# Patient Record
Sex: Male | Born: 1955 | Race: White | Hispanic: No | Marital: Single | Smoking: Never smoker
Health system: Southern US, Community
[De-identification: ages and names within clinical notes are randomized; demographics above are authoritative.]

## PROBLEM LIST (undated history)

## (undated) DIAGNOSIS — J342 Deviated nasal septum: Secondary | ICD-10-CM

## (undated) DIAGNOSIS — N400 Enlarged prostate without lower urinary tract symptoms: Secondary | ICD-10-CM

## (undated) DIAGNOSIS — E039 Hypothyroidism, unspecified: Secondary | ICD-10-CM

## (undated) DIAGNOSIS — I10 Essential (primary) hypertension: Secondary | ICD-10-CM

## (undated) DIAGNOSIS — K219 Gastro-esophageal reflux disease without esophagitis: Secondary | ICD-10-CM

## (undated) DIAGNOSIS — M1990 Unspecified osteoarthritis, unspecified site: Secondary | ICD-10-CM

## (undated) DIAGNOSIS — I82409 Acute embolism and thrombosis of unspecified deep veins of unspecified lower extremity: Secondary | ICD-10-CM

## (undated) DIAGNOSIS — E785 Hyperlipidemia, unspecified: Secondary | ICD-10-CM

## (undated) DIAGNOSIS — J45909 Unspecified asthma, uncomplicated: Secondary | ICD-10-CM

## (undated) DIAGNOSIS — G4730 Sleep apnea, unspecified: Secondary | ICD-10-CM

## (undated) HISTORY — DX: Hyperlipidemia, unspecified: E78.5

## (undated) HISTORY — PX: CARDIOVERSION: SHX1299

## (undated) HISTORY — DX: Deviated nasal septum: J34.2

## (undated) HISTORY — DX: Acute embolism and thrombosis of unspecified deep veins of unspecified lower extremity: I82.409

## (undated) HISTORY — DX: Benign prostatic hyperplasia without lower urinary tract symptoms: N40.0

---

## 1999-11-01 ENCOUNTER — Ambulatory Visit (HOSPITAL_COMMUNITY): Admission: EM | Admit: 1999-11-01 | Discharge: 1999-11-01 | Payer: Self-pay

## 2002-08-03 ENCOUNTER — Encounter: Payer: Self-pay | Admitting: Otolaryngology

## 2002-08-03 ENCOUNTER — Encounter: Admission: RE | Admit: 2002-08-03 | Discharge: 2002-08-03 | Payer: Self-pay | Admitting: Otolaryngology

## 2002-09-13 ENCOUNTER — Encounter: Admission: RE | Admit: 2002-09-13 | Discharge: 2002-09-13 | Payer: Self-pay | Admitting: Otolaryngology

## 2002-09-13 ENCOUNTER — Encounter: Payer: Self-pay | Admitting: Otolaryngology

## 2002-10-31 ENCOUNTER — Ambulatory Visit (HOSPITAL_BASED_OUTPATIENT_CLINIC_OR_DEPARTMENT_OTHER): Admission: RE | Admit: 2002-10-31 | Discharge: 2002-10-31 | Payer: Self-pay | Admitting: Otolaryngology

## 2005-06-23 HISTORY — PX: HERNIA REPAIR: SHX51

## 2006-04-14 ENCOUNTER — Ambulatory Visit (HOSPITAL_COMMUNITY): Admission: RE | Admit: 2006-04-14 | Discharge: 2006-04-14 | Payer: Self-pay | Admitting: General Surgery

## 2009-06-25 ENCOUNTER — Ambulatory Visit: Payer: Self-pay | Admitting: Surgery

## 2009-06-25 ENCOUNTER — Encounter (INDEPENDENT_AMBULATORY_CARE_PROVIDER_SITE_OTHER): Payer: Self-pay | Admitting: Family Medicine

## 2009-06-25 ENCOUNTER — Ambulatory Visit: Admission: RE | Admit: 2009-06-25 | Discharge: 2009-06-25 | Payer: Self-pay | Admitting: Family Medicine

## 2010-02-11 ENCOUNTER — Encounter: Admission: RE | Admit: 2010-02-11 | Discharge: 2010-02-11 | Payer: Self-pay | Admitting: Internal Medicine

## 2010-11-08 NOTE — Procedures (Signed)
[facility]  Patient:    Michael Parsons, Michael Parsons                       MRN: [id number] Proc. Date: 11/01/99 Attending:  Verlin Grills, M.D. CC:         Petra Kuba, M.D.                           Procedure Report  PROCEDURE:  Esophagogastroduodenoscopy.  INDICATIONS:  Mr. Michael Parsons is a 55 year old male who has roast beef obstructing his esophagus.  Mr. Michael Parsons is followed by Dr. Vida Rigger and takes Prevacid for chronic gastroesophageal reflux.  He last required esophageal dilation approximately two years ago.  I discussed with Mr. Michael Parsons the complications associated with esophagogastroduodenoscopy including intestinal bleeding and intestinal perforation.  Mr. Michael Parsons has signed the operative permit.  ENDOSCOPIST:  Verlin Grills, M.D.  PREMEDICATION:  Demerol 100 mg, Versed 10 mg.  ENDOSCOPE:  Olympus gastroscope.  DESCRIPTION OF PROCEDURE:  After obtaining informed consent, the patient was placed in the left lateral decubitus position.  I administered intravenous Demerol and intravenous Versed to achieve conscious sedation for the procedure.  The patients blood pressure, oxygen saturation, and cardiac rhythm were monitored throughout the procedure and documented in the medical record.  The Olympus gastroscope was passed through the posterior hypopharynx into the proximal esophagus without difficulty.  The hypopharynx, pharynx, and vocal chords appeared normal.  ESOPHAGOSCOPY:  A proximal and mid esophagus appears normal.  There is food obstructing the distal esophagus.  With gentle distal esophageal irrigation and gentle pressure applied to the obstructing food bolus, I was able to push the obstructing food bolus into the stomach.  Examination of the distal esophagus once the obstruction was removed reveals mild mucosal scarring and mucosal erosions but no tight esophageal stricture.  GASTROSCOPY:  Retroflex view of the  gastric cardia was normal.  The gastric body and fundus is filled with solid food.  The gastric antrum appears normal. The pylorus appears normal.  DUODENOSCOPY:  The duodenal bulb and descending duodenum appears normal.  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  Food obstructing the distal esophagus relieved.  PLAN:  I will ask Michael Parsons to follow up with Dr. Vida Rigger.  He may require an esophageal dilation. DD:  11/02/99 TD:  11/05/99 Job: [id number]

## 2010-11-08 NOTE — Op Note (Signed)
Michael Parsons, Michael Parsons                          ACCOUNT NO.:  [id number]   MEDICAL RECORD NO.:  [id number]                   PATIENT TYPE:  AMB   LOCATION:  DSC                                  FACILITY:  [facility]   PHYSICIAN:  Karol T. Lazarus Salines, M.D.              DATE OF BIRTH:  02-23-1956   DATE OF PROCEDURE:  10/31/2002  DATE OF DISCHARGE:                                 OPERATIVE REPORT   PREOPERATIVE DIAGNOSES:  1. Dysphagia.  2. Question esophageal mass.   POSTOPERATIVE DIAGNOSES:  1. No mass identified.  2. Reflux esophagitis.   PROCEDURES:  1. Direct laryngoscopy.  2. Esophagoscopy.  3. Maloney dilation.   SURGEON:  Gloris Manchester. Lazarus Salines, M.D.   ANESTHESIA:  General orotracheal.   ESTIMATED BLOOD LOSS:  None.   COMPLICATIONS:  None.   FINDINGS:  Normal oral cavity and oropharynx to visualization and palpation.  Normal hypopharynx and larynx.  Cervical esophagoscopy and then a full-  length esophagoscopy, no mass identified in the thoracic inlet region.  Slight distal esophagitis consistent with reflux.  No stricture identified,  but I could not pass an endoscope all the way into the stomach.  Easily  dilated to the 46 French size without any apparent obstruction or  restriction.   DESCRIPTION OF PROCEDURE:  With the patient in a comfortable supine  position, general orotracheal anesthesia was induced without difficulty.  At  an appropriate level, the table was turned 90 degrees and the patient placed  in a slightly reverse Trendelenburg.  A clean preparation and draping was  accomplished.  A rubber tooth guard was placed on the upper teeth.  The  Holinger laryngoscope was introduced and inspection of the oropharynx and  hypopharynx and larynx was performed with the findings as described above.  The instrument was removed.  The cervical esophagoscope was lubricated and  inserted into the pharynx and easily passed through the cricopharyngeus and  to the full length of  the instrument with no significant findings.  There  were some frothy reflux secretions.  Even with careful palpation and  corroboration with the barium swallow and the MRI scan, which were in the  room, no mass could be identified.  No evidence of a polyp or a  diverticulum.  No surface irregularity or visible lesion.  The cervical  esophagoscope was removed.   The full-length esophagoscope was lubricated.  The patient was placed in a  more extended position, and this was inserted and passed also to its full  length with the findings as described above.  Once again careful inspection  in the thoracic inlet region where the mass was visible on the barium  swallow and the MRI scan was completely negative.  The esophagoscope was  removed.   Beginning with a #40 Maloney dilator lubricated with K-Y jelly, this was  passed into the pharynx with digital guidance and passed to the full length  of the dilator with absolutely no restriction and no difficulty.  Upon  removing the dilator, there was no blood visible.  Next a 75 and finally a  46 [place] Maloney dilator were used in the same fashion.  Again there was no  restriction.  They were passed easily, and there was no evidence of blood.  At this point the procedure was completed.  The rubber tooth guard was  removed and the dental status was intact.  The patient was returned to  anesthesia, awakened, extubated, and transferred to recovery in stable  condition.   COMMENT:  A 55 year old white male with a long history of incompletely-  controlled reflux, a known distal esophageal stricture, one episode of food  impaction several years back, and now a history of progressive globus  sensation and dysphagia was the indication for his evaluation.  Evaluation  with a barium swallow suggested a smooth mass in the right lateral esophagus  at just below the thoracic inlet.  An MRI scan showed a similar soft tissue  lesion in the same vicinity  approximately 1 cm in greatest extent.  On  today's endoscopy, no lesion was identified.  The lesion was suspected to be  submucosal, possibly a leiomyoma, which corroborates today's negative  findings.  He does have some distal esophagitis but no evidence of stricture  at the present time.  Given low anticipated risk of postanesthetic or  postsurgical complications, feel an outpatient venue is appropriate.  Will  emphasize analgesia, reflux control, and advancement of diet.  I would  recommend either a repeat barium swallow or a repeat MRI scan in six months  to make sure that the lesion is not  enlarging or progressing.                                               Gloris Manchester. Lazarus Salines, M.D.    KTW/MEDQ  D:  10/31/2002  T:  11/01/2002  Job:  [id number]   cc:   Theressa Millard, M.D.  [address] [place]  [place] [address]  Fax: [phone]   Petra Kuba, M.D.  [address], [address]  [place]  [place] [address]  Fax: [phone]

## 2010-11-08 NOTE — Op Note (Signed)
NAME:  Michael Parsons, Michael Parsons              ACCOUNT NO.:  [id number]   MEDICAL RECORD NO.:  [id number]          PATIENT TYPE:  AMB   LOCATION:  DAY                          FACILITY:  [facility]   PHYSICIAN:  Adolph Pollack, M.D.DATE OF BIRTH:  08/26/1955   DATE OF PROCEDURE:  04/14/2006  DATE OF DISCHARGE:                                 OPERATIVE REPORT   PREOPERATIVE DIAGNOSIS:  Umbilical hernia.   POSTOPERATIVE DIAGNOSIS:  Umbilical hernia.   PROCEDURE:  Umbilical hernia repair with mesh.   SURGEON:  Adolph Pollack, M.D.   ANESTHESIA:  General plus local (0.25% Marcaine with epinephrine).   INDICATIONS:  This is a 55 year old male who has noticed a progressively  enlarging bulge around the umbilicus.  It is consistent with a moderate- to  large-sized umbilical hernia with some stretching of the skin.  He now  presents for repair.  We have discussed the procedure, risks and aftercare  preoperatively.   TECHNIQUE:  He was seen in the holding area and then brought to the  operating room, placed supine on the operating table and a general  anesthetic was administered.  The hair in the periumbilical area was clipped  and the area was sterilely prepped and draped.  Marcaine solution was  infiltrated superficially and deep in a periumbilical fashion.  A  subumbilical curvilinear incision was made through the skin and subcutaneous  tissue until fascia was identified.  Electrocautery was used to separate the  subcutaneous tissue from the fascia in a periumbilical fashion.  The  umbilicus was freed from its fascial attachments, exposing the underlying  hernia, and the sac was opened, partially excised and omental contents  reduced back into the abdominal cavity.  I then made sure I had adequate  fascial exposure about 3 cm circumferentially around the primary defect.  The defect was then closed primarily with interrupted 0 Surgilon sutures,  which were left long.  A piece of polypropylene  mesh was brought into the  field; the sutures were then threaded over the mesh and tied down, directly  anchoring the center portion of the mesh over the primary repair.  The  periphery of the mesh was then anchored to the fascia with a running 0  Prolene suture.  This provided for more than adequate coverage of the defect  as well as adequate overlap.   The excess mesh was trimmed.  Local anesthetic was infiltrated in the  fascia.  Hemostasis was adequate.  I then reimplanted the umbilicus on the  mesh with a 3-0 Vicryl suture.  The subcutaneous tissue was closed over the  mesh with a running 3-0 Vicryl suture.  The skin was closed with a 4-0  Monocryl subcuticular stitch followed by Steri-Strips and a sterile  dressing.   He tolerated the procedure well without any apparent complications and was  taken to the recovery room in satisfactory condition.      Adolph Pollack, M.D.  Electronically Signed     TJR/MEDQ  D:  04/14/2006  T:  04/15/2006  Job:  [id number]   cc:   Theressa Millard, M.D.  Fax: [phone]

## 2012-10-18 ENCOUNTER — Other Ambulatory Visit: Payer: Self-pay | Admitting: Internal Medicine

## 2012-10-18 DIAGNOSIS — M79641 Pain in right hand: Secondary | ICD-10-CM

## 2012-10-18 DIAGNOSIS — M5412 Radiculopathy, cervical region: Secondary | ICD-10-CM

## 2012-10-18 DIAGNOSIS — M542 Cervicalgia: Secondary | ICD-10-CM

## 2012-10-21 ENCOUNTER — Ambulatory Visit
Admission: RE | Admit: 2012-10-21 | Discharge: 2012-10-21 | Disposition: A | Discharge status: Home / Self Care | Payer: BC Managed Care – PPO | Source: Ambulatory Visit | Attending: Internal Medicine | Admitting: Internal Medicine

## 2012-10-21 ENCOUNTER — Other Ambulatory Visit: Payer: Self-pay | Admitting: Internal Medicine

## 2012-10-21 DIAGNOSIS — M5412 Radiculopathy, cervical region: Secondary | ICD-10-CM

## 2012-10-21 DIAGNOSIS — Z139 Encounter for screening, unspecified: Secondary | ICD-10-CM

## 2012-10-21 DIAGNOSIS — M79641 Pain in right hand: Secondary | ICD-10-CM

## 2012-10-21 DIAGNOSIS — M542 Cervicalgia: Secondary | ICD-10-CM

## 2012-10-23 ENCOUNTER — Other Ambulatory Visit: Payer: Self-pay

## 2012-11-02 ENCOUNTER — Encounter (HOSPITAL_COMMUNITY): Payer: Self-pay | Admitting: Pharmacy Technician

## 2012-11-05 ENCOUNTER — Encounter (HOSPITAL_COMMUNITY): Payer: Self-pay

## 2012-11-05 ENCOUNTER — Encounter (HOSPITAL_COMMUNITY)
Admission: RE | Admit: 2012-11-05 | Discharge: 2012-11-05 | Disposition: A | Discharge status: Home / Self Care | Payer: BC Managed Care – PPO | Source: Ambulatory Visit | Attending: Neurological Surgery | Admitting: Neurological Surgery

## 2012-11-05 ENCOUNTER — Ambulatory Visit (HOSPITAL_COMMUNITY)
Admission: RE | Admit: 2012-11-05 | Discharge: 2012-11-05 | Disposition: A | Discharge status: Home / Self Care | Payer: BC Managed Care – PPO | Source: Ambulatory Visit | Attending: Anesthesiology | Admitting: Anesthesiology

## 2012-11-05 DIAGNOSIS — M5090 Cervical disc disorder, unspecified, unspecified cervical region: Secondary | ICD-10-CM | POA: Insufficient documentation

## 2012-11-05 DIAGNOSIS — I359 Nonrheumatic aortic valve disorder, unspecified: Secondary | ICD-10-CM | POA: Insufficient documentation

## 2012-11-05 DIAGNOSIS — Z01812 Encounter for preprocedural laboratory examination: Secondary | ICD-10-CM | POA: Insufficient documentation

## 2012-11-05 DIAGNOSIS — I4510 Unspecified right bundle-branch block: Secondary | ICD-10-CM | POA: Insufficient documentation

## 2012-11-05 DIAGNOSIS — Z01818 Encounter for other preprocedural examination: Secondary | ICD-10-CM | POA: Insufficient documentation

## 2012-11-05 DIAGNOSIS — Z01810 Encounter for preprocedural cardiovascular examination: Secondary | ICD-10-CM | POA: Insufficient documentation

## 2012-11-05 HISTORY — DX: Essential (primary) hypertension: I10

## 2012-11-05 HISTORY — DX: Unspecified osteoarthritis, unspecified site: M19.90

## 2012-11-05 HISTORY — DX: Hypothyroidism, unspecified: E03.9

## 2012-11-05 HISTORY — DX: Unspecified asthma, uncomplicated: J45.909

## 2012-11-05 HISTORY — DX: Gastro-esophageal reflux disease without esophagitis: K21.9

## 2012-11-05 HISTORY — DX: Sleep apnea, unspecified: G47.30

## 2012-11-05 LAB — BASIC METABOLIC PANEL
BUN: 20 mg/dL (ref 6–23)
Calcium: 9.3 mg/dL (ref 8.4–10.5)
Chloride: 102 mEq/L (ref 96–112)
Creatinine, Ser: 0.89 mg/dL (ref 0.50–1.35)
GFR calc Af Amer: >90 mL/min (ref >90)

## 2012-11-05 LAB — SURGICAL PCR SCREEN: MRSA, PCR: NEGATIVE

## 2012-11-05 LAB — CBC
HCT: 46.1 % (ref 39.0–52.0)
MCHC: 35.4 g/dL (ref 30.0–36.0)
MCV: 88.1 fL (ref 78.0–100.0)
Platelets: 218 10*3/uL (ref 150–400)
RDW: 13.7 % (ref 11.5–15.5)

## 2012-11-05 NOTE — Pre-Procedure Instructions (Signed)
Michael Parsons  11/05/2012   Your procedure is scheduled on:  Thursday, May 22nd  Report to [facility] at 1030 AM.  Call this number if you have problems the morning of surgery: [phone]   Remember:   Do not eat food or drink liquids after midnight.    Take these medicines the morning of surgery with A SIP OF WATER: Zyrtec, Advair, Nasalide, Synthroid, Prilosec, Percocet if needed   Do not wear jewelry.  Do not wear lotions, powders, or perfumes, deodorant.  Do not shave 48 hours prior to surgery. Men may shave face and neck.  Do not bring valuables to the hospital.  Contacts, dentures or bridgework may not be worn into surgery.  Leave suitcase in the car. After surgery it may be brought to your room.  For patients admitted to the hospital, checkout time is 11:00 AM the day of discharge.   Patients discharged the day of surgery will not be allowed to drive home.   Special Instructions: Shower using CHG 2 nights before surgery and the night before surgery.  If you shower the day of surgery use CHG.  Use special wash - you have one bottle of CHG for all showers.  You should use approximately 1/3 of the bottle for each shower.   Please read over the following fact sheets that you were given: Pain Booklet, Coughing and Deep Breathing, MRSA Information and Surgical Site Infection Prevention

## 2012-11-05 NOTE — Progress Notes (Signed)
Primary Physician - Dr. Sharlyn Bologna No cardiologist. No cardiac testing since 90s

## 2012-11-08 NOTE — Progress Notes (Signed)
Anesthesia chart review: Patient is a 57 year old male posted for 2 level ACDF by Dr. Yetta Barre on 11/11/2012.  History includes nonsmoker, obesity, hypertension, obstructive sleep apnea, hypothyroidism, GERD, asthma, arthritis, hernia repair. PCP is Dr. Sharlyn Bologna.  EKG on 11/05/12 showed NSR, possible LAE, incomplete right BBB.  He denied prior cardiac testing since at least the 1990's.  No CV symptoms were documented at his PAT visit.  Preoperative lab and chest x-ray noted.  Patient will be evaluated by his assigned anesthesiologist on the day of surgery. If no acute change in his status then anticipate he can proceed as planned.  [facility]/Anesthesiology Phone [phone] 11/08/2012 5:50 PM

## 2012-11-09 NOTE — Progress Notes (Signed)
REQUESTED ORDERS FROM DR. Yetta Barre OFFICE, VANESSA STATED SHE WOULD TAKE CARE OF IT.

## 2012-11-10 ENCOUNTER — Other Ambulatory Visit: Payer: Self-pay | Admitting: Neurological Surgery

## 2012-11-10 MED ORDER — DEXAMETHASONE SODIUM PHOSPHATE 10 MG/ML IJ SOLN
10.0000 mg | INTRAMUSCULAR | Status: AC
  Administered 2012-11-11: 10 mg via INTRAVENOUS
  Filled 2012-11-10: qty 1

## 2012-11-10 MED ORDER — CEFAZOLIN SODIUM-DEXTROSE 2-3 GM-% IV SOLR
2.0000 g | INTRAVENOUS | Status: AC
  Administered 2012-11-11: 2 g via INTRAVENOUS
  Filled 2012-11-10: qty 50

## 2012-11-11 ENCOUNTER — Encounter (HOSPITAL_COMMUNITY): Payer: Self-pay | Admitting: *Deleted

## 2012-11-11 ENCOUNTER — Ambulatory Visit (HOSPITAL_COMMUNITY): Payer: BC Managed Care – PPO | Admitting: Anesthesiology

## 2012-11-11 ENCOUNTER — Ambulatory Visit (HOSPITAL_COMMUNITY)
Admission: RE | Admit: 2012-11-11 | Discharge: 2012-11-12 | Disposition: A | Discharge status: Home / Self Care | Payer: BC Managed Care – PPO | Source: Ambulatory Visit | Attending: Neurological Surgery | Admitting: Neurological Surgery

## 2012-11-11 ENCOUNTER — Ambulatory Visit (HOSPITAL_COMMUNITY): Payer: BC Managed Care – PPO

## 2012-11-11 ENCOUNTER — Encounter (HOSPITAL_COMMUNITY): Payer: Self-pay | Admitting: Vascular Surgery

## 2012-11-11 ENCOUNTER — Encounter (HOSPITAL_COMMUNITY)
Admission: RE | Disposition: A | Discharge status: Home / Self Care | Payer: Self-pay | Source: Ambulatory Visit | Attending: Neurological Surgery

## 2012-11-11 DIAGNOSIS — Z981 Arthrodesis status: Secondary | ICD-10-CM

## 2012-11-11 DIAGNOSIS — Z79899 Other long term (current) drug therapy: Secondary | ICD-10-CM | POA: Insufficient documentation

## 2012-11-11 DIAGNOSIS — M5020 Other cervical disc displacement, unspecified cervical region: Secondary | ICD-10-CM | POA: Insufficient documentation

## 2012-11-11 DIAGNOSIS — M47812 Spondylosis without myelopathy or radiculopathy, cervical region: Secondary | ICD-10-CM | POA: Insufficient documentation

## 2012-11-11 DIAGNOSIS — I10 Essential (primary) hypertension: Secondary | ICD-10-CM | POA: Insufficient documentation

## 2012-11-11 HISTORY — PX: ANTERIOR CERVICAL DECOMP/DISCECTOMY FUSION: SHX1161

## 2012-11-11 LAB — CBC WITH DIFFERENTIAL/PLATELET
Basophils Absolute: 0 10*3/uL (ref 0.0–0.1)
Basophils Relative: 1 % (ref 0–1)
Eosinophils Absolute: 0.1 10*3/uL (ref 0.0–0.7)
Hemoglobin: 15.8 g/dL (ref 13.0–17.0)
MCH: 30.9 pg (ref 26.0–34.0)
MCHC: 35 g/dL (ref 30.0–36.0)
Monocytes Relative: 7 % (ref 3–12)
Neutro Abs: 5.1 10*3/uL (ref 1.7–7.7)
Neutrophils Relative %: 75 % (ref 43–77)
RDW: 13.7 % (ref 11.5–15.5)

## 2012-11-11 LAB — BASIC METABOLIC PANEL
BUN: 18 mg/dL (ref 6–23)
Calcium: 9.3 mg/dL (ref 8.4–10.5)
Creatinine, Ser: 0.85 mg/dL (ref 0.50–1.35)
GFR calc Af Amer: >90 mL/min (ref >90)
GFR calc non Af Amer: >90 mL/min (ref >90)
Glucose, Bld: 111 mg/dL — ABNORMAL HIGH (ref 70–99)

## 2012-11-11 LAB — PROTIME-INR: INR: 0.95 (ref 0.00–1.49)

## 2012-11-11 SURGERY — ANTERIOR CERVICAL DECOMPRESSION/DISCECTOMY FUSION 2 LEVELS
Anesthesia: General | Site: Spine Cervical | Wound class: Clean

## 2012-11-11 MED ORDER — SENNA 8.6 MG PO TABS
1.0000 | ORAL_TABLET | Freq: Two times a day (BID) | ORAL | Status: DC
  Administered 2012-11-11 – 2012-11-12 (×2): 8.6 mg via ORAL
  Filled 2012-11-11 (×3): qty 1

## 2012-11-11 MED ORDER — HYDROMORPHONE HCL PF 1 MG/ML IJ SOLN
INTRAMUSCULAR | Status: AC
  Filled 2012-11-11: qty 1

## 2012-11-11 MED ORDER — ONDANSETRON HCL 4 MG/2ML IJ SOLN: 4.0000 mg | INTRAMUSCULAR | Status: DC | PRN

## 2012-11-11 MED ORDER — ALBUTEROL SULFATE HFA 108 (90 BASE) MCG/ACT IN AERS
INHALATION_SPRAY | RESPIRATORY_TRACT | Status: DC | PRN
  Administered 2012-11-11: 4 via RESPIRATORY_TRACT

## 2012-11-11 MED ORDER — HEMOSTATIC AGENTS (NO CHARGE) OPTIME
TOPICAL | Status: DC | PRN
  Administered 2012-11-11: 1 via TOPICAL

## 2012-11-11 MED ORDER — HYDROCHLOROTHIAZIDE 25 MG PO TABS
25.0000 mg | ORAL_TABLET | Freq: Every day | ORAL | Status: DC
  Administered 2012-11-12: 25 mg via ORAL
  Filled 2012-11-11 (×2): qty 1

## 2012-11-11 MED ORDER — DEXAMETHASONE SODIUM PHOSPHATE 4 MG/ML IJ SOLN
4.0000 mg | Freq: Four times a day (QID) | INTRAMUSCULAR | Status: DC
  Administered 2012-11-11: 4 mg via INTRAVENOUS
  Filled 2012-11-11 (×5): qty 1

## 2012-11-11 MED ORDER — DEXAMETHASONE 4 MG PO TABS
4.0000 mg | ORAL_TABLET | Freq: Four times a day (QID) | ORAL | Status: DC
  Administered 2012-11-11 – 2012-11-12 (×2): 4 mg via ORAL
  Filled 2012-11-11 (×7): qty 1

## 2012-11-11 MED ORDER — ACETAMINOPHEN 10 MG/ML IV SOLN
1000.0000 mg | Freq: Four times a day (QID) | INTRAVENOUS | Status: DC
  Administered 2012-11-11 – 2012-11-12 (×3): 1000 mg via INTRAVENOUS
  Filled 2012-11-11 (×4): qty 100

## 2012-11-11 MED ORDER — FENTANYL CITRATE 0.05 MG/ML IJ SOLN
INTRAMUSCULAR | Status: DC | PRN
  Administered 2012-11-11: 100 ug via INTRAVENOUS
  Administered 2012-11-11 (×3): 50 ug via INTRAVENOUS

## 2012-11-11 MED ORDER — LEVOTHYROXINE SODIUM 75 MCG PO TABS
75.0000 ug | ORAL_TABLET | Freq: Every day | ORAL | Status: DC
  Administered 2012-11-12: 75 ug via ORAL
  Filled 2012-11-11 (×2): qty 1

## 2012-11-11 MED ORDER — ROCURONIUM BROMIDE 100 MG/10ML IV SOLN
INTRAVENOUS | Status: DC | PRN
  Administered 2012-11-11: 50 mg via INTRAVENOUS
  Administered 2012-11-11: 5 mg via INTRAVENOUS
  Administered 2012-11-11: 10 mg via INTRAVENOUS

## 2012-11-11 MED ORDER — ACETAMINOPHEN 325 MG PO TABS: 650.0000 mg | ORAL_TABLET | ORAL | Status: DC | PRN

## 2012-11-11 MED ORDER — BUPIVACAINE HCL (PF) 0.25 % IJ SOLN
INTRAMUSCULAR | Status: DC | PRN
  Administered 2012-11-11: 4 mL

## 2012-11-11 MED ORDER — HYDROMORPHONE HCL PF 1 MG/ML IJ SOLN
0.2500 mg | INTRAMUSCULAR | Status: DC | PRN
  Administered 2012-11-11 (×2): 0.5 mg via INTRAVENOUS

## 2012-11-11 MED ORDER — METHOCARBAMOL 100 MG/ML IJ SOLN
500.0000 mg | Freq: Four times a day (QID) | INTRAVENOUS | Status: DC | PRN
  Administered 2012-11-11: 500 mg via INTRAVENOUS
  Filled 2012-11-11: qty 5

## 2012-11-11 MED ORDER — BACITRACIN 50000 UNITS IM SOLR
INTRAMUSCULAR | Status: AC
  Filled 2012-11-11: qty 1

## 2012-11-11 MED ORDER — OXYCODONE HCL 5 MG PO TABS
ORAL_TABLET | ORAL | Status: AC
  Filled 2012-11-11: qty 1

## 2012-11-11 MED ORDER — EPHEDRINE SULFATE 50 MG/ML IJ SOLN
INTRAMUSCULAR | Status: DC | PRN
  Administered 2012-11-11 (×2): 5 mg via INTRAVENOUS

## 2012-11-11 MED ORDER — CEFAZOLIN SODIUM 1-5 GM-% IV SOLN
1.0000 g | Freq: Three times a day (TID) | INTRAVENOUS | Status: AC
  Administered 2012-11-11 – 2012-11-12 (×2): 1 g via INTRAVENOUS
  Filled 2012-11-11 (×2): qty 50

## 2012-11-11 MED ORDER — PANTOPRAZOLE SODIUM 40 MG PO TBEC
40.0000 mg | DELAYED_RELEASE_TABLET | Freq: Every day | ORAL | Status: DC
  Administered 2012-11-12: 40 mg via ORAL
  Filled 2012-11-11: qty 1

## 2012-11-11 MED ORDER — OXYCODONE HCL 5 MG/5ML PO SOLN: 5.0000 mg | Freq: Once | ORAL | Status: AC | PRN

## 2012-11-11 MED ORDER — ARTIFICIAL TEARS OP OINT
TOPICAL_OINTMENT | OPHTHALMIC | Status: DC | PRN
  Administered 2012-11-11: 1 via OPHTHALMIC

## 2012-11-11 MED ORDER — SODIUM CHLORIDE 0.9 % IR SOLN
Status: DC | PRN
  Administered 2012-11-11: 14:00:00

## 2012-11-11 MED ORDER — OXYCODONE HCL 5 MG PO TABS
10.0000 mg | ORAL_TABLET | ORAL | Status: DC | PRN
  Administered 2012-11-11 – 2012-11-12 (×3): 10 mg via ORAL
  Filled 2012-11-11 (×3): qty 2

## 2012-11-11 MED ORDER — SODIUM CHLORIDE 0.9 % IJ SOLN: 3.0000 mL | INTRAMUSCULAR | Status: DC | PRN

## 2012-11-11 MED ORDER — OXYCODONE HCL 5 MG PO TABS
5.0000 mg | ORAL_TABLET | Freq: Once | ORAL | Status: AC | PRN
  Administered 2012-11-11: 5 mg via ORAL

## 2012-11-11 MED ORDER — TURMERIC CURCUMIN 500 MG PO CAPS: 500.0000 mg | ORAL_CAPSULE | Freq: Every day | ORAL | Status: DC

## 2012-11-11 MED ORDER — THROMBIN 5000 UNITS EX SOLR
OROMUCOSAL | Status: DC | PRN
  Administered 2012-11-11: 14:00:00 via TOPICAL

## 2012-11-11 MED ORDER — THROMBIN 5000 UNITS EX SOLR
CUTANEOUS | Status: DC | PRN
  Administered 2012-11-11 (×2): 5000 [IU] via TOPICAL

## 2012-11-11 MED ORDER — ONDANSETRON HCL 4 MG/2ML IJ SOLN
INTRAMUSCULAR | Status: DC | PRN
  Administered 2012-11-11: 4 mg via INTRAVENOUS

## 2012-11-11 MED ORDER — MOMETASONE FURO-FORMOTEROL FUM 100-5 MCG/ACT IN AERO
2.0000 | INHALATION_SPRAY | Freq: Two times a day (BID) | RESPIRATORY_TRACT | Status: DC
  Administered 2012-11-11 – 2012-11-12 (×2): 2 via RESPIRATORY_TRACT
  Filled 2012-11-11: qty 8.8

## 2012-11-11 MED ORDER — GLYCOPYRROLATE 0.2 MG/ML IJ SOLN
INTRAMUSCULAR | Status: DC | PRN
  Administered 2012-11-11: 0.6 mg via INTRAVENOUS

## 2012-11-11 MED ORDER — PHENOL 1.4 % MT LIQD: 1.0000 | OROMUCOSAL | Status: DC | PRN

## 2012-11-11 MED ORDER — LIDOCAINE HCL 4 % MT SOLN
OROMUCOSAL | Status: DC | PRN
  Administered 2012-11-11: 4 mL via TOPICAL

## 2012-11-11 MED ORDER — PROPOFOL 10 MG/ML IV BOLUS
INTRAVENOUS | Status: DC | PRN
  Administered 2012-11-11: 200 mg via INTRAVENOUS

## 2012-11-11 MED ORDER — LACTATED RINGERS IV SOLN
INTRAVENOUS | Status: DC | PRN
  Administered 2012-11-11 (×2): via INTRAVENOUS

## 2012-11-11 MED ORDER — LIDOCAINE HCL (CARDIAC) 20 MG/ML IV SOLN
INTRAVENOUS | Status: DC | PRN
  Administered 2012-11-11: 80 mg via INTRAVENOUS

## 2012-11-11 MED ORDER — MIDAZOLAM HCL 5 MG/5ML IJ SOLN
INTRAMUSCULAR | Status: DC | PRN
  Administered 2012-11-11 (×2): 1 mg via INTRAVENOUS

## 2012-11-11 MED ORDER — ONDANSETRON HCL 4 MG/2ML IJ SOLN: 4.0000 mg | Freq: Four times a day (QID) | INTRAMUSCULAR | Status: DC | PRN

## 2012-11-11 MED ORDER — ACETAMINOPHEN 10 MG/ML IV SOLN
INTRAVENOUS | Status: AC
  Administered 2012-11-11: 1000 mg via INTRAVENOUS
  Filled 2012-11-11: qty 100

## 2012-11-11 MED ORDER — METHOCARBAMOL 500 MG PO TABS
500.0000 mg | ORAL_TABLET | Freq: Four times a day (QID) | ORAL | Status: DC | PRN
  Administered 2012-11-12 (×2): 500 mg via ORAL
  Filled 2012-11-11 (×3): qty 1

## 2012-11-11 MED ORDER — SODIUM CHLORIDE 0.9 % IV SOLN
INTRAVENOUS | Status: AC
  Filled 2012-11-11: qty 500

## 2012-11-11 MED ORDER — NEOSTIGMINE METHYLSULFATE 1 MG/ML IJ SOLN
INTRAMUSCULAR | Status: DC | PRN
  Administered 2012-11-11: 4 mg via INTRAVENOUS

## 2012-11-11 MED ORDER — POTASSIUM CHLORIDE IN NACL 20-0.9 MEQ/L-% IV SOLN
INTRAVENOUS | Status: DC
  Administered 2012-11-12: 03:00:00 via INTRAVENOUS
  Filled 2012-11-11 (×3): qty 1000

## 2012-11-11 MED ORDER — MENTHOL 3 MG MT LOZG: 1.0000 | LOZENGE | OROMUCOSAL | Status: DC | PRN

## 2012-11-11 MED ORDER — PHENYLEPHRINE HCL 10 MG/ML IJ SOLN
INTRAMUSCULAR | Status: DC | PRN
  Administered 2012-11-11 (×6): 80 ug via INTRAVENOUS

## 2012-11-11 MED ORDER — MORPHINE SULFATE 2 MG/ML IJ SOLN: 1.0000 mg | INTRAMUSCULAR | Status: DC | PRN

## 2012-11-11 MED ORDER — SODIUM CHLORIDE 0.9 % IJ SOLN
3.0000 mL | Freq: Two times a day (BID) | INTRAMUSCULAR | Status: DC
  Administered 2012-11-11: 3 mL via INTRAVENOUS

## 2012-11-11 MED ORDER — ACETAMINOPHEN 650 MG RE SUPP: 650.0000 mg | RECTAL | Status: DC | PRN

## 2012-11-11 MED ORDER — 0.9 % SODIUM CHLORIDE (POUR BTL) OPTIME
TOPICAL | Status: DC | PRN
  Administered 2012-11-11: 1000 mL

## 2012-11-11 MED ORDER — ZOLPIDEM TARTRATE 5 MG PO TABS: 5.0000 mg | ORAL_TABLET | Freq: Every evening | ORAL | Status: DC | PRN

## 2012-11-11 SURGICAL SUPPLY — 55 items
APL SKNCLS STERI-STRIP NONHPOA (GAUZE/BANDAGES/DRESSINGS) ×1
BAG DECANTER FOR FLEXI CONT (MISCELLANEOUS) ×2 IMPLANT
BENZOIN TINCTURE PRP APPL 2/3 (GAUZE/BANDAGES/DRESSINGS) ×2 IMPLANT
BONE MATRIX OSTEOCEL PRO SM (Bone Implant) ×1 IMPLANT
BUR MATCHSTICK NEURO 3.0 LAGG (BURR) ×2 IMPLANT
CAGE COROENT 9X13X15 (Cage) ×1 IMPLANT
CAGE LORDOTIC 8 SM PLUS (Cage) ×1 IMPLANT
CANISTER SUCTION 2500CC (MISCELLANEOUS) ×2 IMPLANT
CLOTH BEACON ORANGE TIMEOUT ST (SAFETY) ×2 IMPLANT
CONT SPEC 4OZ CLIKSEAL STRL BL (MISCELLANEOUS) ×2 IMPLANT
DRAPE C-ARM 42X72 X-RAY (DRAPES) ×4 IMPLANT
DRAPE LAPAROTOMY 100X72 PEDS (DRAPES) ×2 IMPLANT
DRAPE MICROSCOPE ZEISS OPMI (DRAPES) ×1 IMPLANT
DRAPE POUCH INSTRU U-SHP 10X18 (DRAPES) ×2 IMPLANT
DRESSING TELFA 8X3 (GAUZE/BANDAGES/DRESSINGS) ×2 IMPLANT
DRILL BIT HELIX (BIT) ×1 IMPLANT
DRSG OPSITE 4X5.5 SM (GAUZE/BANDAGES/DRESSINGS) ×2 IMPLANT
DURAPREP 6ML APPLICATOR 50/CS (WOUND CARE) ×2 IMPLANT
ELECT COATED BLADE 2.86 ST (ELECTRODE) ×2 IMPLANT
ELECT REM PT RETURN 9FT ADLT (ELECTROSURGICAL) ×2
ELECTRODE REM PT RTRN 9FT ADLT (ELECTROSURGICAL) ×1 IMPLANT
GAUZE SPONGE 4X4 16PLY XRAY LF (GAUZE/BANDAGES/DRESSINGS) IMPLANT
GLOVE BIOGEL M 8.0 STRL (GLOVE) ×2 IMPLANT
GLOVE BIOGEL PI IND STRL 8 (GLOVE) IMPLANT
GLOVE BIOGEL PI IND STRL 8.5 (GLOVE) IMPLANT
GLOVE BIOGEL PI INDICATOR 8 (GLOVE) ×2
GLOVE BIOGEL PI INDICATOR 8.5 (GLOVE) ×1
GLOVE ECLIPSE 7.5 STRL STRAW (GLOVE) ×3 IMPLANT
GLOVE ECLIPSE 8.5 STRL (GLOVE) ×1 IMPLANT
GOWN BRE IMP SLV AUR LG STRL (GOWN DISPOSABLE) IMPLANT
GOWN BRE IMP SLV AUR XL STRL (GOWN DISPOSABLE) ×2 IMPLANT
GOWN STRL REIN 2XL LVL4 (GOWN DISPOSABLE) ×2 IMPLANT
HEMOSTAT POWDER KIT SURGIFOAM (HEMOSTASIS) ×2 IMPLANT
KIT BASIN OR (CUSTOM PROCEDURE TRAY) ×2 IMPLANT
KIT ROOM TURNOVER OR (KITS) ×2 IMPLANT
NDL HYPO 25X1 1.5 SAFETY (NEEDLE) ×1 IMPLANT
NDL SPNL 20GX3.5 QUINCKE YW (NEEDLE) ×1 IMPLANT
NEEDLE HYPO 25X1 1.5 SAFETY (NEEDLE) ×2 IMPLANT
NEEDLE SPNL 20GX3.5 QUINCKE YW (NEEDLE) ×2 IMPLANT
NS IRRIG 1000ML POUR BTL (IV SOLUTION) ×2 IMPLANT
PACK LAMINECTOMY NEURO (CUSTOM PROCEDURE TRAY) ×2 IMPLANT
PAD ARMBOARD 7.5X6 YLW CONV (MISCELLANEOUS) ×6 IMPLANT
PLATE HELIX 2LVL 44MM SPINE (Plate) ×1 IMPLANT
RUBBERBAND STERILE (MISCELLANEOUS) ×4 IMPLANT
SCREW 4.0X13 (Screw) IMPLANT
SCREW 4.0X13MM (Screw) ×6 IMPLANT
SPONGE INTESTINAL PEANUT (DISPOSABLE) ×2 IMPLANT
SPONGE SURGIFOAM ABS GEL SZ50 (HEMOSTASIS) ×2 IMPLANT
STRIP CLOSURE SKIN 1/2X4 (GAUZE/BANDAGES/DRESSINGS) ×2 IMPLANT
SUT VIC AB 3-0 SH 8-18 (SUTURE) ×4 IMPLANT
SYR 20ML ECCENTRIC (SYRINGE) ×2 IMPLANT
TOWEL OR 17X24 6PK STRL BLUE (TOWEL DISPOSABLE) ×2 IMPLANT
TOWEL OR 17X26 10 PK STRL BLUE (TOWEL DISPOSABLE) ×2 IMPLANT
TRAP SPECIMEN MUCOUS 40CC (MISCELLANEOUS) IMPLANT
WATER STERILE IRR 1000ML POUR (IV SOLUTION) ×2 IMPLANT

## 2012-11-11 NOTE — Plan of Care (Signed)
Problem: Consults Goal: Diagnosis - Spinal Surgery Outcome: Completed/Met Date Met:  11/11/12 Cervical Spine Fusion

## 2012-11-11 NOTE — Anesthesia Preprocedure Evaluation (Signed)
Anesthesia Evaluation  Patient identified by MRN, date of birth, ID band Patient awake    Reviewed: Allergy & Precautions, H&P , NPO status , Patient's Chart, lab work & pertinent test results  Airway Mallampati: II  Neck ROM: full    Dental   Pulmonary asthma , sleep apnea ,          Cardiovascular hypertension,     Neuro/Psych    GI/Hepatic GERD-  ,  Endo/Other  Hypothyroidism obese  Renal/GU      Musculoskeletal   Abdominal   Peds  Hematology   Anesthesia Other Findings   Reproductive/Obstetrics                           Anesthesia Physical Anesthesia Plan  ASA: II  Anesthesia Plan: General   Post-op Pain Management:    Induction: Intravenous  Airway Management Planned: Oral ETT  Additional Equipment:   Intra-op Plan:   Post-operative Plan: Extubation in OR  Informed Consent: I have reviewed the patients History and Physical, chart, labs and discussed the procedure including the risks, benefits and alternatives for the proposed anesthesia with the patient or authorized representative who has indicated his/her understanding and acceptance.     Plan Discussed with: CRNA, Anesthesiologist and Surgeon  Anesthesia Plan Comments:         Anesthesia Quick Evaluation

## 2012-11-11 NOTE — Op Note (Signed)
11/11/2012  3:36 PM  PATIENT:  Michael Parsons  57 y.o. 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  POST-OPERATIVE DIAGNOSIS:  Same  PROCEDURE:  1. Decompressive anterior cervical discectomy C5-6 C6-7, 2. Anterior cervical arthrodesis C5-6 C6-7 utilizing peek interbody cages packed with local autograft and morcellized allograft, 3. Anterior cervical plating C5-C7 inclusive utilizing a helix plate  SURGEON:  Marikay Alar, MD  ASSISTANTS: Dr. Danielle Dess  ANESTHESIA:   General  EBL: 50 ml  Total I/O In: 1000 [I.V.:1000] Out: 50 [Blood:50]  BLOOD ADMINISTERED:none  DRAINS: None   SPECIMEN:  No Specimen  INDICATION FOR PROCEDURE: This patient presented with acute pain in the neck and right arm with inability to extend his fingers. MRI showed disc protrusions at C5-6 and C6-7 on the right with compression of the right C6 and C7 nerve roots recommended decompressive surgery given his progressive weakness in his hand. Patient understood the risks, benefits, and alternatives and potential outcomes and wished to proceed.  PROCEDURE DETAILS: Patient was brought to the operating room placed under general endotracheal anesthesia. Patient was placed in the supine position on the operating room table. The neck was prepped with Duraprep and draped in a sterile fashion.   Three cc of local anesthesia was injected and a transverse incision was made on the right side of the neck.  Dissection was carried down thru the subcutaneous tissue and the platysma was  elevated, opened, and undermined with Metzenbaum scissors.  Dissection was then carried out thru an avascular plane leaving the sternocleidomastoid carotid artery and jugular vein laterally and the trachea and esophagus medially. The ventral aspect of the vertebral column was identified and a localizing x-ray was taken. The C5-6 and C6-7 levels were identified. The  longus colli muscles were then elevated and the retractor was placed. The annulus was incised and the disc space entered. Discectomy was performed with micro-curettes and pituitary rongeurs. I then used the high-speed drill to drill the endplates down to the level of the posterior longitudinal ligament. The drill shavings were saved in a mucous trap for later arthrodesis. The operating microscope was draped and brought into the field provided additional magnification, illumination and visualization. Discectomy was continued posteriorly thru the disc space at each level. Posterior longitudinal ligament was opened with a nerve hook at each level, and then removed along with disc herniation and osteophytes, decompressing the spinal canal and thecal sac. We then continued to remove osteophytic overgrowth and disc material decompressing the neural foramina and exiting nerve roots bilaterally. Obviously particular attention was given to the right side given his right-sided symptoms. The scope was angled up and down to help decompress and undercut the vertebral bodies. Once the decompression was completed we could pass a nerve hook circumferentially to assure adequate decompression in the midline and in the neural foramina. So by both visualization and palpation we felt we had an adequate decompression of the neural elements. We then measured the height of the intravertebral disc spaces and selected a 8 millimeter Peek interbody cage for C5-6 and a 9 mm cage for C6-7 and packed the with autograft and morcellized allograft. They were then gently positioned in the intravertebral disc space and countersunk. I then used a 44 mm helix plate and placed variable angle screws into the vertebral bodies and locked them into position. The wound was irrigated with bacitracin solution, checked for hemostasis which was established and confirmed. Once  meticulous hemostasis was achieved, we then proceeded with closure. The platysma was  closed with interrupted 3-0 undyed Vicryl suture, the subcuticular layer was closed with interrupted 3-0 undyed Vicryl suture. The skin edges were approximated with steristrips. The drapes were removed. A sterile dressing was applied. The patient was then awakened from general anesthesia and transferred to the recovery room in stable condition. At the end of the procedure all sponge, needle and instrument counts were correct.   PLAN OF CARE: Admit for overnight observation  PATIENT DISPOSITION:  PACU - hemodynamically stable.   Delay start of Pharmacological VTE agent (>24hrs) due to surgical blood loss or risk of bleeding:  yes

## 2012-11-11 NOTE — H&P (Signed)
Subjective:   Patient is a 57 y.o. male admitted for ACDF. The patient first presented to me with complaints of neck pain, shooting pains in the arm(s), numbness of the arm(s) and loss of strength of the arm(s). Onset of symptoms was a few weeks ago. The pain is described as aching and sharp and occurs all day. The pain is rated intense, and is located at the base of the neck and radiates to the right arm. He has trouble extending the fingers on his right hand. The symptoms have been progressive. Symptoms are exacerbated by extending head backwards, and are relieved by none.  Previous work up includes MRI of cervical spine, results: disc bulge at C5-C6 and C6-C7 right.  Past Medical History  Diagnosis Date  . Hypertension   . Hypothyroidism   . Asthma     does not have to use rescue inhaler often  . Sleep apnea     cpap at night  . GERD (gastroesophageal reflux disease)   . Arthritis     carpel tunnel    Past Surgical History  Procedure Laterality Date  . Hernia repair  2007    umbilical    Allergies  Allergen Reactions  . Sulfa Antibiotics Other (See Comments)    Unknown reaction    History  Substance Use Topics  . Smoking status: Never Smoker   . Smokeless tobacco: Not on file  . Alcohol Use: 0.0 oz/week    1-2 Cans of beer per week     Comment: 1-2 beers few times per week    History reviewed. No pertinent family history. Prior to Admission medications   Medication Sig Start Date End Date Taking? Authorizing Provider  acetaminophen (TYLENOL) 500 MG tablet Take 1,000 mg by mouth every 6 (six) hours as needed for pain.   Yes Historical Provider, MD  aspirin (ASPIRIN EC) 81 MG EC tablet Take 81 mg by mouth daily. Swallow whole.   Yes Historical Provider, MD  cetirizine (ZYRTEC) 10 MG tablet Take 10 mg by mouth daily.   Yes Historical Provider, MD  flunisolide (NASALIDE) 25 MCG/ACT (0.025%) SOLN Inhale 2 sprays into the lungs 2 (two) times daily.   Yes Historical Provider, MD   Fluticasone-Salmeterol (ADVAIR) 250-50 MCG/DOSE AEPB Inhale 1 puff into the lungs daily.   Yes Historical Provider, MD  hydrochlorothiazide (HYDRODIURIL) 25 MG tablet Take 25 mg by mouth daily.   Yes Historical Provider, MD  ibuprofen (ADVIL,MOTRIN) 200 MG tablet Take 600 mg by mouth daily as needed for pain.   Yes Historical Provider, MD  levothyroxine (SYNTHROID, LEVOTHROID) 75 MCG tablet Take 75 mcg by mouth daily before breakfast.   Yes Historical Provider, MD  Omega-3 Fatty Acids (FISH OIL PO) Take 1,800 mg by mouth daily.   Yes Historical Provider, MD  omeprazole (PRILOSEC) 20 MG capsule Take 20 mg by mouth daily.   Yes Historical Provider, MD  Turmeric Curcumin 500 MG CAPS Take 500 mg by mouth daily.   Yes Historical Provider, MD  b complex vitamins tablet Take 1 tablet by mouth daily.    Historical Provider, MD  oxyCODONE-acetaminophen (PERCOCET/ROXICET) 5-325 MG per tablet Take 1-2 tablets by mouth every 4 (four) hours as needed for pain.    Historical Provider, MD     Review of Systems  Positive ROS: neg  All other systems have been reviewed and were otherwise negative with the exception of those mentioned in the HPI and as above.  Objective: Vital signs in last 24 hours:  Temp:  [98.2 F (36.8 C)] 98.2 F (36.8 C) (05/22 1050) Pulse Rate:  [79] 79 (05/22 1050) Resp:  [20] 20 (05/22 1050) BP: (160)/(83) 160/83 mmHg (05/22 1050) SpO2:  [98 %] 98 % (05/22 1050)  General Appearance: Alert, cooperative, no distress, appears stated age Head: Normocephalic, without obvious abnormality, atraumatic Eyes: PERRL, conjunctiva/corneas clear, EOM's intact      Neck: Supple Lungs:  respirations unlabored Heart: Regular rate and rhythm Abdomen: Soft, non-tender Extremities: Extremities normal, atraumatic, no cyanosis or edema Pulses: 2+ and symmetric all extremities Skin: Skin color, texture, turgor normal, no rashes or lesions  NEUROLOGIC:  Mental status: Alert and oriented x4,  no aphasia, good attention span, fund of knowledge and memory  Motor Exam - grossly normal except weakness in finger extension on the right Sensory Exam - grossly normal Reflexes: 1+ Coordination - grossly normal Gait - grossly normal Balance - grossly normal Cranial Nerves: I: smell Not tested  II: visual acuity  OS: nl    OD: nl  II: visual fields Full to confrontation  II: pupils Equal, round, reactive to light  III,VII: ptosis None  III,IV,VI: extraocular muscles  Full ROM  V: mastication Normal  V: facial light touch sensation  Normal  V,VII: corneal reflex  Present  VII: facial muscle function - upper  Normal  VII: facial muscle function - lower Normal  VIII: hearing Not tested  IX: soft palate elevation  Normal  IX,X: gag reflex Present  XI: trapezius strength  5/5  XI: sternocleidomastoid strength 5/5  XI: neck flexion strength  5/5  XII: tongue strength  Normal    Data Review Lab Results  Component Value Date   WBC 6.8 11/11/2012   HGB 15.8 11/11/2012   HCT 45.1 11/11/2012   MCV 88.3 11/11/2012   PLT 193 11/11/2012   Lab Results  Component Value Date   NA 141 11/05/2012   K 3.9 11/05/2012   CL 102 11/05/2012   CO2 27 11/05/2012   BUN 20 11/05/2012   CREATININE 0.89 11/05/2012   GLUCOSE 92 11/05/2012   Lab Results  Component Value Date   INR 0.95 11/11/2012    Assessment:   Cervical neck pain with herniated nucleus pulposus/ 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 Patient has failed conservative therapy. Planned surgery : ACDF with plate Z6-1 W9-6  Plan:   I explained the condition and procedure to the patient and answered any questions.  Patient wishes to proceed with procedure as planned. Understands risks/ benefits/ and expected or typical outcomes.  JONES,DAVID S 11/11/2012 12:22 PM

## 2012-11-11 NOTE — Preoperative (Signed)
Beta Blockers   Reason not to administer Beta Blockers:Not Applicable 

## 2012-11-11 NOTE — Anesthesia Postprocedure Evaluation (Signed)
Anesthesia Post Note  Patient: [name]  Procedure(s) Performed: Procedure(s) (LRB): ANTERIOR CERVICAL DECOMPRESSION/DISCECTOMY FUSION 2 LEVELS (N/A)  Anesthesia type: General  Patient location: PACU  Post pain: Pain level controlled and Adequate analgesia  Post assessment: Post-op Vital signs reviewed, Patient's Cardiovascular Status Stable, Respiratory Function Stable, Patent Airway and Pain level controlled  Last Vitals:  Filed Vitals:   11/11/12 1607  BP: 147/68  Pulse: 97  Temp:   Resp: 16    Post vital signs: Reviewed and stable  Level of consciousness: awake, alert  and oriented  Complications: No apparent anesthesia complications

## 2012-11-11 NOTE — Transfer of Care (Signed)
Immediate Anesthesia Transfer of Care Note  Patient: Michael Parsons  Procedure(s) Performed: Procedure(s) with comments: ANTERIOR CERVICAL DECOMPRESSION/DISCECTOMY FUSION 2 LEVELS (N/A) - Cervical five-six,Cervical six-seven  Patient Location: PACU  Anesthesia Type:General  Level of Consciousness: awake, alert  and oriented  Airway & Oxygen Therapy: Patient Spontanous Breathing and Patient connected to nasal cannula oxygen  Post-op Assessment: Report given to PACU RN  Post vital signs: Reviewed and stable  Complications: No apparent anesthesia complications

## 2012-11-11 NOTE — Progress Notes (Signed)
Rt placed patient on cpap 11cmH20 via patient's home nasal mask. Patient is tolerating cpap well at this time.

## 2012-11-12 ENCOUNTER — Encounter (HOSPITAL_COMMUNITY): Payer: Self-pay | Admitting: Neurological Surgery

## 2012-11-12 MED ORDER — METHOCARBAMOL 500 MG PO TABS: 500.0000 mg | ORAL_TABLET | Freq: Four times a day (QID) | ORAL | Status: DC | PRN

## 2012-11-12 MED ORDER — OXYCODONE HCL 10 MG PO TABS: 10.0000 mg | ORAL_TABLET | ORAL | Status: DC | PRN

## 2012-11-12 NOTE — Discharge Summary (Signed)
Physician Discharge Summary  Patient ID: Michael Parsons MRN: [id number] DOB/AGE: 1956/03/01 57 y.o.  Admit date: 11/11/2012 Discharge date: 11/12/2012  Admission Diagnoses: cervical spondylosis, radiculopathy, hand weakness (finger drop)    Discharge Diagnoses: same   Discharged Condition: good  Hospital Course: The patient was admitted on 11/11/2012 and taken to the operating room where the patient underwent ACDF C5-6 C6-7. The patient tolerated the procedure well and was taken to the recovery room and then to the floor in stable condition. The hospital course was routine. There were no complications. The wound remained clean dry and intact. Pt had appropriate neck and shoulder soreness. No complaints of arm pain or new N/T/W. The patient remained afebrile with stable vital signs, and tolerated a regular diet. The patient continued to increase activities, and pain was well controlled with oral pain medications.   Consults: None  Significant Diagnostic Studies:  Results for orders placed during the hospital encounter of 11/11/12  BASIC METABOLIC PANEL      Result Value Range   Sodium 140  135 - 145 mEq/L   Potassium 3.8  3.5 - 5.1 mEq/L   Chloride 103  96 - 112 mEq/L   CO2 24  19 - 32 mEq/L   Glucose, Bld 111 (*) 70 - 99 mg/dL   BUN 18  6 - 23 mg/dL   Creatinine, Ser 4.09  0.50 - 1.35 mg/dL   Calcium 9.3  8.4 - 81.1 mg/dL   GFR calc non Af Amer >90  >90 mL/min   GFR calc Af Amer >90  >90 mL/min  CBC WITH DIFFERENTIAL      Result Value Range   WBC 6.8  4.0 - 10.5 K/uL   RBC 5.11  4.22 - 5.81 MIL/uL   Hemoglobin 15.8  13.0 - 17.0 g/dL   HCT 91.4  78.2 - 95.6 %   MCV 88.3  78.0 - 100.0 fL   MCH 30.9  26.0 - 34.0 pg   MCHC 35.0  30.0 - 36.0 g/dL   RDW 21.3  08.6 - 57.8 %   Platelets 193  150 - 400 K/uL   Neutrophils Relative % 75  43 - 77 %   Neutro Abs 5.1  1.7 - 7.7 K/uL   Lymphocytes Relative 16  12 - 46 %   Lymphs Abs 1.1  0.7 - 4.0 K/uL   Monocytes Relative 7  3 - 12  %   Monocytes Absolute 0.5  0.1 - 1.0 K/uL   Eosinophils Relative 2  0 - 5 %   Eosinophils Absolute 0.1  0.0 - 0.7 K/uL   Basophils Relative 1  0 - 1 %   Basophils Absolute 0.0  0.0 - 0.1 K/uL  PROTIME-INR      Result Value Range   Prothrombin Time 12.6  11.6 - 15.2 seconds   INR 0.95  0.00 - 1.49    Dg Eye Foreign Body  10/21/2012   *RADIOLOGY REPORT*  Clinical Data: Pre MRI.  History of exposure to metal.  ORBITS FOR FOREIGN BODY - 2 VIEW  Comparison: None  Findings: No evidence of radiopaque metallic foreign body projecting over the orbits.  Visualized bony structures unremarkable.  IMPRESSION: No evidence of radiopaque foreign body within the orbits.   Original Report Authenticated By: Charlett Nose, M.D.   Dg Chest 2 View  11/05/2012   *RADIOLOGY REPORT*  Clinical Data: Cervical disc disease. Preoperative respiratory exam.  CHEST - 2 VIEW  Comparison: 04/10/2006  Findings: The heart  size and pulmonary vascularity are normal and the lungs are clear.  No acute osseous abnormality.  IMPRESSION: No acute disease.   Original Report Authenticated By: Francene Boyers, M.D.   Dg Cervical Spine 2-3 Views  11/11/2012   *RADIOLOGY REPORT*  Clinical Data: ACDF  DG C-ARM 1-60 MIN,CERVICAL SPINE - 2-3 VIEW  Technique:  C-arm fluoroscopic images were obtained intraoperatively and submitted for postoperative interpretation. Please see the performing provider's procedural report for the fluoroscopy time utilized.  Fluoroscopy time:  8 seconds  Comparison: MRI cervical spine dated 10/21/2012  Findings: Two lateral fluoroscopic spot images were obtained during ACDF.  Second fluoroscopic image demonstrates ACDF hardware at C5-7, incompletely visualized  IMPRESSION: Intraoperative fluoroscopic images during C5-7 ACDF, as described above.   Original Report Authenticated By: Charline Bills, M.D.   Mr Cervical Spine Wo Contrast  10/21/2012   *RADIOLOGY REPORT*  Clinical Data: Neck and right hand pain for 2 weeks.   MRI CERVICAL SPINE WITHOUT CONTRAST  Technique:  Multiplanar and multiecho pulse sequences of the cervical spine, to include the craniocervical junction and cervicothoracic junction, were obtained according to standard protocol without intravenous contrast.  Comparison: Plain film cervical spine 10/18/2012.  Findings: Vertebral body height, signal and alignment are maintained.  The craniocervical junction is normal and cervical cord signal is normal.  Imaged paraspinous structures are unremarkable.  C2-3:  Negative.  C3-4:  There is mild disc bulging and uncovertebral disease, greater on the right.  Moderate right foraminal narrowing is identified.  The central canal and left foramen are open.  C4-5:  Mild disc bulge and uncovertebral disease without central canal or foraminal narrowing.  C5-6:  The patient has a disc osteophyte complex eccentric to the right and right worse than left uncovertebral disease.  Severe bilateral foraminal narrowing appears worse on the right.  Right aspect of the ventral thecal sac is effaced.  C6-7:  The patient has a small right paracentral disc protrusion and uncovertebral disease on the right.  There is some encroachment on the right C7 root as it enters the foramen.  Left foramen is widely patent.  The central canal is open.  C7-T1:  There is a disc bulge eccentric to the left causing left foraminal narrowing.  Central canal and right foramen are open.  IMPRESSION:  1.  Spondylosis most notable at C5-6 where a disc osteophyte complex to the right and bulky bilateral uncovertebral disease cause marked bilateral foraminal narrowing, worse on the right. Ventral thecal sac is effaced at this level. 2.  Right paracentral disc protrusion and uncovertebral disease on the right  encroach on the right C7 root as it enters the foramen at C6-7. 3.  Mild moderate right foraminal narrowing C3-4 due to uncovertebral disease. 4.  Disc bulge to the left at C7-T1 causes left foraminal narrowing.    Original Report Authenticated By: Holley Dexter, M.D.   Dg C-arm 1-60 Min  11/11/2012   *RADIOLOGY REPORT*  Clinical Data: ACDF  DG C-ARM 1-60 MIN,CERVICAL SPINE - 2-3 VIEW  Technique:  C-arm fluoroscopic images were obtained intraoperatively and submitted for postoperative interpretation. Please see the performing provider's procedural report for the fluoroscopy time utilized.  Fluoroscopy time:  8 seconds  Comparison: MRI cervical spine dated 10/21/2012  Findings: Two lateral fluoroscopic spot images were obtained during ACDF.  Second fluoroscopic image demonstrates ACDF hardware at C5-7, incompletely visualized  IMPRESSION: Intraoperative fluoroscopic images during C5-7 ACDF, as described above.   Original Report Authenticated By: Charline Bills, M.D.  Antibiotics:  Anti-infectives   Start     Dose/Rate Route Frequency Ordered Stop   11/11/12 1800  ceFAZolin (ANCEF) IVPB 1 g/50 mL premix     1 g 100 mL/hr over 30 Minutes Intravenous Every 8 hours 11/11/12 1714 11/12/12 0257   11/11/12 1343  bacitracin 50,000 Units in sodium chloride irrigation 0.9 % 500 mL irrigation  Status:  Discontinued       As needed 11/11/12 1343 11/11/12 1544   11/11/12 1244  bacitracin 16109 UNITS injection    Comments:  DAY, DORY: cabinet override      11/11/12 1244 11/12/12 0044   11/11/12 0600  ceFAZolin (ANCEF) IVPB 2 g/50 mL premix     2 g 100 mL/hr over 30 Minutes Intravenous On call to O.R. 11/10/12 1413 11/11/12 1319      Discharge Exam: Blood pressure 150/78, pulse 89, temperature 97.6 F (36.4 C), temperature source Oral, resp. rate 18, SpO2 94.00%. Neurologic: Grossly normal except R finger drop Incision CDI Discharge Medications:     Medication List    STOP taking these medications       ibuprofen 200 MG tablet  Commonly known as:  ADVIL,MOTRIN      TAKE these medications       acetaminophen 500 MG tablet  Commonly known as:  TYLENOL  Take 1,000 mg by mouth every 6 (six) hours  as needed for pain.     aspirin EC 81 MG EC tablet  Generic drug:  aspirin  Take 81 mg by mouth daily. Swallow whole.     b complex vitamins tablet  Take 1 tablet by mouth daily.     cetirizine 10 MG tablet  Commonly known as:  ZYRTEC  Take 10 mg by mouth daily.     FISH OIL PO  Take 1,800 mg by mouth daily.     flunisolide 25 MCG/ACT (0.025%) Soln  Commonly known as:  NASALIDE  Inhale 2 sprays into the lungs 2 (two) times daily.     Fluticasone-Salmeterol 250-50 MCG/DOSE Aepb  Commonly known as:  ADVAIR  Inhale 1 puff into the lungs daily.     hydrochlorothiazide 25 MG tablet  Commonly known as:  HYDRODIURIL  Take 25 mg by mouth daily.     levothyroxine 75 MCG tablet  Commonly known as:  SYNTHROID, LEVOTHROID  Take 75 mcg by mouth daily before breakfast.     methocarbamol 500 MG tablet  Commonly known as:  ROBAXIN  Take 1 tablet (500 mg total) by mouth every 6 (six) hours as needed.     omeprazole 20 MG capsule  Commonly known as:  PRILOSEC  Take 20 mg by mouth daily.     Oxycodone HCl 10 MG Tabs  Take 1 tablet (10 mg total) by mouth every 4 (four) hours as needed.     oxyCODONE-acetaminophen 5-325 MG per tablet  Commonly known as:  PERCOCET/ROXICET  Take 1-2 tablets by mouth every 4 (four) hours as needed for pain.     Turmeric Curcumin 500 MG Caps  Take 500 mg by mouth daily.        Disposition: home  Final Dx: ACDF     Discharge Orders   Future Orders Complete By Expires     Call MD for:  difficulty breathing, headache or visual disturbances  As directed     Call MD for:  persistant nausea and vomiting  As directed     Call MD for:  redness, tenderness, or signs of infection (  pain, swelling, redness, odor or green/yellow discharge around incision site)  As directed     Call MD for:  severe uncontrolled pain  As directed     Call MD for:  temperature >100.4  As directed     Diet - low sodium heart healthy  As directed     Discharge instructions  As  directed     Comments:      No heavy lifting or strenuous activity    Increase activity slowly  As directed     Remove dressing in 24 hours  As directed        Follow-up Information   Follow up with Derrisha Foos S, MD In 2 weeks.   Contact information:   [address] [place] [address] [phone]        Signed: Tia Alert 11/12/2012, 9:13 AM

## 2013-08-10 ENCOUNTER — Other Ambulatory Visit: Payer: Self-pay | Admitting: Neurological Surgery

## 2013-08-10 DIAGNOSIS — R29898 Other symptoms and signs involving the musculoskeletal system: Secondary | ICD-10-CM

## 2013-08-29 ENCOUNTER — Ambulatory Visit
Admission: RE | Admit: 2013-08-29 | Discharge: 2013-08-29 | Disposition: A | Discharge status: Home / Self Care | Payer: BC Managed Care – PPO | Source: Ambulatory Visit | Attending: Neurological Surgery | Admitting: Neurological Surgery

## 2013-08-29 DIAGNOSIS — R29898 Other symptoms and signs involving the musculoskeletal system: Secondary | ICD-10-CM

## 2016-03-17 ENCOUNTER — Other Ambulatory Visit: Payer: Self-pay | Admitting: Internal Medicine

## 2016-03-17 ENCOUNTER — Ambulatory Visit
Admission: RE | Admit: 2016-03-17 | Discharge: 2016-03-17 | Disposition: A | Discharge status: Home / Self Care | Payer: BLUE CROSS/BLUE SHIELD | Source: Ambulatory Visit | Attending: Internal Medicine | Admitting: Internal Medicine

## 2016-03-17 DIAGNOSIS — M545 Low back pain: Secondary | ICD-10-CM

## 2016-04-23 HISTORY — PX: BACK SURGERY: SHX140

## 2016-04-24 ENCOUNTER — Other Ambulatory Visit: Payer: Self-pay | Admitting: Internal Medicine

## 2016-04-24 DIAGNOSIS — M5416 Radiculopathy, lumbar region: Secondary | ICD-10-CM

## 2016-04-28 ENCOUNTER — Ambulatory Visit
Admission: RE | Admit: 2016-04-28 | Discharge: 2016-04-28 | Disposition: A | Discharge status: Home / Self Care | Payer: BLUE CROSS/BLUE SHIELD | Source: Ambulatory Visit | Attending: Neurological Surgery | Admitting: Neurological Surgery

## 2016-04-28 ENCOUNTER — Other Ambulatory Visit: Payer: Self-pay | Admitting: Neurological Surgery

## 2016-04-28 DIAGNOSIS — M5416 Radiculopathy, lumbar region: Secondary | ICD-10-CM

## 2016-05-05 ENCOUNTER — Other Ambulatory Visit: Payer: BLUE CROSS/BLUE SHIELD

## 2016-07-02 ENCOUNTER — Other Ambulatory Visit: Payer: Self-pay | Admitting: Gastroenterology

## 2016-10-13 ENCOUNTER — Encounter (HOSPITAL_COMMUNITY): Payer: Self-pay | Admitting: *Deleted

## 2016-10-14 ENCOUNTER — Ambulatory Visit (HOSPITAL_COMMUNITY)
Admission: RE | Admit: 2016-10-14 | Payer: BLUE CROSS/BLUE SHIELD | Source: Ambulatory Visit | Admitting: Gastroenterology

## 2016-10-14 SURGERY — COLONOSCOPY WITH PROPOFOL
Anesthesia: Monitor Anesthesia Care

## 2016-10-16 ENCOUNTER — Other Ambulatory Visit: Payer: Self-pay | Admitting: Gastroenterology

## 2016-11-18 ENCOUNTER — Encounter (HOSPITAL_COMMUNITY): Payer: Self-pay | Admitting: *Deleted

## 2016-11-18 NOTE — Progress Notes (Signed)
Per Dr. Laural BenesJohnson, all cases for 11/19/16 cancelled due to problems with the air conditioning in the procedure rooms. Pt aware of cancellation and will call office to reschedule. Weston SettleBethany Khylen Riolo, RN

## 2016-11-19 ENCOUNTER — Ambulatory Visit (HOSPITAL_COMMUNITY)
Admission: RE | Admit: 2016-11-19 | Payer: BLUE CROSS/BLUE SHIELD | Source: Ambulatory Visit | Admitting: Gastroenterology

## 2016-11-19 SURGERY — COLONOSCOPY WITH PROPOFOL
Anesthesia: Monitor Anesthesia Care

## 2019-09-29 ENCOUNTER — Ambulatory Visit (HOSPITAL_COMMUNITY)
Admission: RE | Admit: 2019-09-29 | Discharge: 2019-09-29 | Disposition: A | Discharge status: Home / Self Care | Payer: Medicare Other | Source: Ambulatory Visit | Attending: Internal Medicine | Admitting: Internal Medicine

## 2019-09-29 ENCOUNTER — Other Ambulatory Visit (HOSPITAL_COMMUNITY): Payer: Self-pay | Admitting: Internal Medicine

## 2019-09-29 ENCOUNTER — Other Ambulatory Visit: Payer: Self-pay

## 2019-09-29 DIAGNOSIS — H539 Unspecified visual disturbance: Secondary | ICD-10-CM

## 2019-09-29 DIAGNOSIS — G4484 Primary exertional headache: Secondary | ICD-10-CM | POA: Insufficient documentation

## 2019-10-17 ENCOUNTER — Institutional Professional Consult (permissible substitution): Payer: Medicare Other | Admitting: Neurology

## 2019-10-18 ENCOUNTER — Encounter: Payer: Self-pay | Admitting: Neurology

## 2019-10-18 ENCOUNTER — Other Ambulatory Visit: Payer: Self-pay

## 2019-10-18 ENCOUNTER — Ambulatory Visit (INDEPENDENT_AMBULATORY_CARE_PROVIDER_SITE_OTHER): Payer: Medicare Other | Admitting: Neurology

## 2019-10-18 VITALS — BP 128/76 | HR 71 | Temp 98.2°F | Ht 72.0 in | Wt 284.0 lb

## 2019-10-18 DIAGNOSIS — Z9989 Dependence on other enabling machines and devices: Secondary | ICD-10-CM

## 2019-10-18 DIAGNOSIS — G4733 Obstructive sleep apnea (adult) (pediatric): Secondary | ICD-10-CM

## 2019-10-18 NOTE — Progress Notes (Signed)
Subjective:    Patient ID: Michael Parsons is a 64 y.o. male.  HPI     Huston Foley, MD, PhD [facility] [address] [address] [place], [place] [address]  Dear Dr. Wylene Simmer,   I saw your patient, Michael Parsons, upon your kind request, in my Sleep clinic today for initial consultation of his sleep disorder, in particular, evaluation of his prior diagnosis of obstructive sleep apnea.  The patient is unaccompanied today.  As you know, Michael Parsons is a 64 year old right-handed gentleman with an underlying medical history of hypertension, hyperlipidemia, BPH, allergies, asthma,  reflux disease, degenerative spine disease with status post neck and lower back surgeries with some residual numbness in the right upper and lower extremities reported, and obesity, who was diagnosed with moderate obstructive sleep apnea and 1998.  He had a sleep study on 04/26/1997 with an AHI of 27/h.  He has been on CPAP therapy since 2006 and had seen Dr. Earl Gala for sleep and primary care in the past. He recently received a new CPAP machine, in Feb 2021.  I reviewed your virtual visit note from 09/21/2019.  I was able to review his compliance data from 09/03/2019 through 10/02/2019, during which time he used his machine every night with percent use days greater than 4 hours at 100%, indicating superb compliance, average usage of over 7 hours, average AHI at goal at 1.9/h, leak acceptable with a 95th percentile at 11.2 L/min on a pressure of 13 cm with EPR of 3.  He reports that when he was initially diagnosed with obstructive sleep apnea he could not tolerate the mask.  Since 2006 he has been fully compliant with CPAP therapy.  His original pressure was 11 cm.  He tolerates the 13 cm.  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  He is divorced, he lives alone, currently his daughter is staying with him.  He has 3 children.  He typically goes to bed around 10 and  rise time is around 6.  He does not have night to night nocturia and reports no recurrent morning headaches, sleeps well with his CPAP, Epworth sleepiness score is 6 out of 24, fatigue severity score is 21 out of 63.  He does watch TV in his bedroom but turns it off at night.  He has 3 dogs in the household, one of them sleeps on the floor in his bedroom at night.  He reports a family history of sleep apnea, his mother, age 53 has sleep apnea and uses a CPAP machine.  Father is 67 years old.  He drinks caffeine in the form of coffee, 4 to 5 cups/day.  His Past Medical History Is Significant For: Past Medical History:  Diagnosis Date  . Arthritis    carpel tunnel  . Asthma    does not have to use rescue inhaler often  . Asthma   . BPH (benign prostatic hyperplasia)   . Deviated septum   . GERD (gastroesophageal reflux disease)   . Hyperlipidemia   . Hypertension   . Hypothyroidism   . Hypothyroidism   . Sleep apnea    cpap at night    His Past Surgical History Is Significant For: Past Surgical History:  Procedure Laterality Date  . ANTERIOR CERVICAL DECOMP/DISCECTOMY FUSION N/A 11/11/2012   Procedure: ANTERIOR CERVICAL DECOMPRESSION/DISCECTOMY FUSION 2 LEVELS;  Surgeon: Tia Alert, MD;  Location: [facility];  Service: Neurosurgery;  Laterality:  N/A;  Cervical five-six,Cervical six-seven  . BACK SURGERY  04/2016   lumar didcectomy  . HERNIA REPAIR  [id number]   umbilical    His Family History Is Significant For: No family history on file.  His Social History Is Significant For: Social History   Socioeconomic History  . Marital status: Married    Spouse name: Not on file  . Number of children: Not on file  . Years of education: Not on file  . Highest education level: Not on file  Occupational History  . Not on file  Tobacco Use  . Smoking status: Never Smoker  . Smokeless tobacco: Never Used  Substance and Sexual Activity  . Alcohol use: Yes    Alcohol/week: 1.0 - 2.0  standard drinks    Types: 1 - 2 Cans of beer per week    Comment: 1-2 beers few times per week  . Drug use: No  . Sexual activity: Not on file  Other Topics Concern  . Not on file  Social History Narrative  . Not on file   Social Determinants of Health   Financial Resource Strain:   . Difficulty of Paying Living Expenses:   Food Insecurity:   . Worried About Charity fundraiser in the Last Year:   . Arboriculturist in the Last Year:   Transportation Needs:   . Film/video editor (Medical):   Marland Kitchen Lack of Transportation (Non-Medical):   Physical Activity:   . Days of Exercise per Week:   . Minutes of Exercise per Session:   Stress:   . Feeling of Stress :   Social Connections:   . Frequency of Communication with Friends and Family:   . Frequency of Social Gatherings with Friends and Family:   . Attends Religious Services:   . Active Member of Clubs or Organizations:   . Attends Archivist Meetings:   Marland Kitchen Marital Status:     His Allergies Are:  Allergies  Allergen Reactions  . Sulfa Antibiotics Other (See Comments)    Unknown reaction  :   His Current Medications Are:  Outpatient Encounter Medications as of 10/18/2019  Medication Sig  . cetirizine (ZYRTEC) 10 MG tablet Take 10 mg by mouth daily as needed for allergies.   Marland Kitchen levothyroxine (SYNTHROID, LEVOTHROID) 88 MCG tablet Take 88 mcg by mouth at bedtime.   Marland Kitchen lisinopril-hydrochlorothiazide (PRINZIDE,ZESTORETIC) 20-12.5 MG tablet Take 1 tablet by mouth daily.  Marland Kitchen omeprazole (PRILOSEC) 20 MG capsule Take 20 mg by mouth daily.  . tamsulosin (FLOMAX) 0.4 MG CAPS capsule Take 0.4 mg by mouth.  . [DISCONTINUED] Flaxseed, Linseed, (FLAX SEED OIL) 1000 MG CAPS Take 1 capsule by mouth daily.  . [DISCONTINUED] flunisolide (NASALIDE) 25 MCG/ACT (0.025%) SOLN Inhale 2 sprays into the lungs 2 (two) times daily as needed.   . [DISCONTINUED] Fluticasone-Salmeterol (ADVAIR) 250-50 MCG/DOSE AEPB Inhale 1 puff into the lungs  daily as needed.   . [DISCONTINUED] Multiple Vitamins-Minerals (MULTIVITAMIN WITH MINERALS) tablet Take 1 tablet by mouth daily.  . [DISCONTINUED] Omega-3 Fatty Acids (FISH OIL PO) Take 1,800 mg by mouth daily.  . [DISCONTINUED] pyridoxine (B-6) 100 MG tablet Take 100 mg by mouth daily.   No facility-administered encounter medications on file as of 10/18/2019.  :  Review of Systems:  Out of a complete 14 point review of systems, all are reviewed and negative with the exception of these symptoms as listed below: Review of Systems  Neurological:  Here for sleep consult. Pt has been on cpap since 2006. Previous MD (Dr. Earl Gala) has retired and needs to establish care. Epworth Sleepiness Scale 0= would never doze 1= slight chance of dozing 2= moderate chance of dozing 3= high chance of dozing  Sitting and reading:1 Watching TV:1 Sitting inactive in a public place (ex. Theater or meeting):1 As a passenger in a car for an hour without a break:0 Lying down to rest in the afternoon:2 Sitting and talking to someone:0 Sitting quietly after lunch (no alcohol):1 In a car, while stopped in traffic:0 Total:6     Objective:  Neurological Exam  Physical Exam Physical Examination:   Vitals:   10/18/19 1132  BP: 128/76  Pulse: 71  Temp: 98.2 F (36.8 C)    General Examination: The patient is a very pleasant 64 y.o. male in no acute distress. He appears well-developed and well-nourished and well groomed.   HEENT: Normocephalic, atraumatic, pupils are equal, round and reactive to light, extraocular tracking is good without limitation to gaze excursion or nystagmus noted. Hearing is grossly intact. Face is symmetric with normal facial animation. Speech is clear with no dysarthria noted. There is no hypophonia. There is no lip, neck/head, jaw or voice tremor. Neck is supple with full range of passive and active motion. There are no carotid bruits on auscultation. Oropharynx exam reveals:  mild mouth dryness, adequate dental hygiene and moderate airway crowding, due to larger uvula, tonsils in place, small. Mallampati is class II. Tongue protrudes centrally and palate elevates symmetrically. Neck size is 19.5 inches.   Chest: Clear to auscultation without wheezing, rhonchi or crackles noted.  Heart: S1+S2+0, regular and normal without murmurs, rubs or gallops noted.   Abdomen: Soft, non-tender and non-distended with normal bowel sounds appreciated on auscultation.  Extremities: There is no pitting edema in the distal lower extremities bilaterally.   Skin: Warm and dry without trophic changes noted.   Musculoskeletal: exam reveals no obvious joint deformities, tenderness or joint swelling or erythema.   Neurologically:  Mental status: The patient is awake, alert and oriented in all 4 spheres. His immediate and remote memory, attention, language skills and fund of knowledge are appropriate. There is no evidence of aphasia, agnosia, apraxia or anomia. Speech is clear with normal prosody and enunciation. Thought process is linear. Mood is normal and affect is normal.  Cranial nerves II - XII are as described above under HEENT exam.  Motor exam: Normal bulk, strength and tone is noted. There is no tremor, Romberg is negative. Fine motor skills and coordination: grossly intact.  Cerebellar testing: No dysmetria or intention tremor. There is no truncal or gait ataxia.  Sensory exam: intact to light touch in the upper and lower extremities.  Gait, station and balance: He stands easily. No veering to one side is noted. No leaning to one side is noted. Posture is age-appropriate and stance is narrow based. Gait shows normal stride length and normal pace. No problems turning are noted. Tandem walk is slightly challenging for him.                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  He was originally diagnosed with moderate obstructive sleep apnea over 20 years ago.  He has been compliant with CPAP therapy since 2006.  He recently received a new CPAP machine in February 2021 and is fully compliant with treatment.  He is commended for his treatment adherence.  He continues to benefit from CPAP therapy, current pressure at 13 cm with adequate apnea control.  He needs an updated sleep study, I suggested we proceed with a home sleep test to update his diagnosis.  We will call him to schedule his home sleep test.  He is encouraged to continue to be fully compliant with his CPAP therapy but for the night of testing he will not use his CPAP of course.   I had a long chat with the patient about my findings and the diagnosis of OSA, its prognosis and treatment options. We talked about medical treatments, surgical interventions and non-pharmacological approaches. I explained in particular the risks and ramifications of untreated moderate to severe OSA, especially with respect to developing cardiovascular disease down the Road, including congestive heart failure, difficult to treat hypertension, cardiac arrhythmias, or stroke. Even type 2 diabetes has, in part, been linked to untreated OSA. Symptoms of untreated OSA include daytime sleepiness, memory problems, mood irritability and mood disorder such as depression and anxiety, lack of energy, as well as recurrent headaches, especially morning headaches.  I plan to see him back after testing.  I answered all his questions today and he was in agreement with the plan.   Thank you very much for allowing me to participate in the care of this nice patient. If I can be of any  further assistance to you please do not hesitate to call me at [phone].  Sincerely,   Huston Foley, MD, PhD

## 2019-10-18 NOTE — Patient Instructions (Signed)
Thank you for choosing [facility] for your sleep related care! It was nice to meet you today! I appreciate that you entrust me with your sleep related healthcare concerns. I hope, I was able to address at least some of your concerns today, and that I can help you feel reassured and also get better.    Here is what we discussed today and what we came up with as our plan for you:    You were diagnosed with sleep apnea over 20 years ago. I think we should proceed with a home test to get your diagnosis updated. You have a new CPAP machine and your apnea score is low. I suggest you continue with the same pressure setting. You are fully compliant with CPAP.   Please remember, the risks and ramifications of moderate to severe obstructive sleep apnea or OSA are: Cardiovascular disease, including congestive heart failure, stroke, difficult to control hypertension, arrhythmias, and even type 2 diabetes has been linked to untreated OSA. Sleep apnea causes disruption of sleep and sleep deprivation in most cases, which, in turn, can cause recurrent headaches, problems with memory, mood, concentration, focus, and vigilance. Most people with untreated sleep apnea report excessive daytime sleepiness, which can affect their ability to drive. Please do not drive if you feel sleepy.   You will need a follow up in sleep clinic after your sleep study.   Our sleep lab administrative assistant will call you to schedule your sleep study. If you don't hear back from her by about 2 weeks from now, please feel free to call her at [phone]. You can leave a message with your phone number and concerns, if you get the voicemail box. She will call back as soon as possible.

## 2019-11-09 ENCOUNTER — Other Ambulatory Visit: Payer: Self-pay

## 2019-11-09 ENCOUNTER — Ambulatory Visit (INDEPENDENT_AMBULATORY_CARE_PROVIDER_SITE_OTHER): Payer: Medicare Other | Admitting: Neurology

## 2019-11-09 DIAGNOSIS — G4733 Obstructive sleep apnea (adult) (pediatric): Secondary | ICD-10-CM | POA: Diagnosis not present

## 2019-11-15 ENCOUNTER — Telehealth: Payer: Self-pay

## 2019-11-15 NOTE — Telephone Encounter (Signed)
-----   Message from Huston Foley, MD sent at 11/15/2019  8:03 AM EDT ----- Patient referred by Dr. Wylene Simmer for re-eval of his OSA. He had recently received a new CPAP and has been compliant, but needed to re-establish the OSA Dx and F2F visit. He was seen by me on 10/18/19 and had a HST on 11/09/19.    Please call and notify the patient that the recent home sleep test showed obstructive sleep apnea in the severe range. He has been on CPAP with good control of his OSA. He is advised to continue with treatment. He should FU in sleep clinic to see one of the NPs for compliance reviewed and FU in about 3 months, within 90 days and hopefully yearly after that. Please arrange FU appt.

## 2019-11-15 NOTE — Procedures (Signed)
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 He received a new CPAP machine, in Feb 2021. He presents for re-establishing his diagnosis of OSA.  Summary & Diagnosis:     OSA Recommendations:     This home sleep test demonstrates severe obstructive sleep apnea with a total AHI of 49.1/hour and O2 nadir of 87%. The patient is well-established on CPAP therapy and will be advised to continue with his current treatment. He will be advised to follow up in sleep clinic in about 3 months. Please note that untreated obstructive sleep apnea may carry additional perioperative morbidity. Patients with significant obstructive sleep apnea should receive perioperative PAP therapy and the surgeons and particularly the anesthesiologist should be informed of the diagnosis and the severity of the sleep disordered breathing. The patient should be cautioned not to drive, work at heights, or operate dangerous or heavy equipment when tired or sleepy. Review and reiteration of good sleep hygiene measures should be pursued with any patient. Other causes of the patient's symptoms, including circadian rhythm disturbances, an underlying mood disorder, medication effect and/or an underlying medical problem cannot be ruled out based on this test. Clinical correlation is recommended. The patient and his  referring provider will be notified of the test results. The patient will be seen in follow up in sleep clinic at [facility].  I certify that I have reviewed the raw data recording prior to the issuance of this report in accordance with the standards of the American Academy of Sleep Medicine (AASM).  Huston Foley, MD, PhD [facility]) Diplomat, ABPN (Neurology and Sleep)              Sleep Summary    Oxygen Saturation Statistics     Start Study Time: End Study Time: Total Recording Time:          11:06:09 PM 6:27:45 AM   7 h, 21 min  Total Sleep Time % REM of Sleep Time:  5 h, 56 min  17.4    Mean: 95 Minimum: 87 Maximum: 99  Mean of Desaturations Nadirs (%):   93  Oxygen Desaturation. %: 4-9 10-20 >20 Total  Events Number Total  171 100.0  0 0.0  0 0.0  171 100.0  Oxygen Saturation: <90 <=88 <85 <80 <70  Duration (minutes): Sleep % 1.0 0.7 0.3 0.2 0.0 0.0 0.0 0.0 0.0 0.0     Respiratory Indices      Total Events REM NREM All Night  pRDI:  322  pAHI:  285 ODI:  171  pAHIc:  66  % CSR: 0.0 25.6 11.3 6.1 2.0 61.5 56.8 34.2 13.3 55.5 49.1 29.5 11.4       Pulse Rate Statistics during Sleep (BPM)      Mean: 64 Minimum: 47 Maximum: 88    Indices are calculated using  technically valid sleep time of 5 h, 48 min. pRDI/pAHI are calculated using oxi desaturations ? 3%  Body Position Statistics  Position Supine Prone Right Left Non-Supine  Sleep (min) 116.5 2.0 79.1 158.5 239.6  Sleep % 32.7 0.6 22.2 44.5 67.3  pRDI 43.4 N/A 75.3 54.3 61.4  pAHI 38.2 N/A 73.0 45.3 54.5  ODI 28.8 N/A 43.0 23.5 29.8     Snoring Statistics Snoring Level (dB) >40 >50 >60 >70 >80 >Threshold (45)  Sleep (min) 139.2 8.6 2.0 0.0 0.0 18.7  Sleep % 39.1 2.4 0.6 0.0 0.0 5.2    Mean: 41 dB Sleep Stages Chart                                                                           pAHI=49.1                                                                                        Mild              Moderate                    Severe                                                 5              15                    30

## 2019-11-15 NOTE — Telephone Encounter (Signed)
I contacted the pt and advised of the results. Pt verbalized understanding. Pt has f/u scheduled with MM,NP.

## 2019-11-15 NOTE — Progress Notes (Signed)
Patient referred by Dr. Wylene Simmer for re-eval of his OSA. He had recently received a new CPAP and has been compliant, but needed to re-establish the OSA Dx and F2F visit. He was seen by me on 10/18/19 and had a HST on 11/09/19.    Please call and notify the patient that the recent home sleep test showed obstructive sleep apnea in the severe range. He has been on CPAP with good control of his OSA. He is advised to continue with treatment. He should FU in sleep clinic to see one of the NPs for compliance reviewed and FU in about 3 months, within 90 days and hopefully yearly after that. Please arrange FU appt.

## 2019-12-06 ENCOUNTER — Encounter: Payer: Self-pay | Admitting: Adult Health

## 2019-12-06 NOTE — Telephone Encounter (Signed)
Pt has called stating that his DME is [facility].  Pt states [facility] informed him they need the results to the sleep study from [facility] and not pt's PCP (Dr Wylene Simmer).

## 2019-12-06 NOTE — Telephone Encounter (Signed)
I have sent [facility] a message advising hst is in epic.

## 2020-01-31 ENCOUNTER — Encounter: Payer: Self-pay | Admitting: Adult Health

## 2020-01-31 ENCOUNTER — Ambulatory Visit (INDEPENDENT_AMBULATORY_CARE_PROVIDER_SITE_OTHER): Payer: Medicare Other | Admitting: Adult Health

## 2020-01-31 VITALS — BP 129/78 | HR 72 | Ht 72.0 in | Wt 284.0 lb

## 2020-01-31 DIAGNOSIS — Z9989 Dependence on other enabling machines and devices: Secondary | ICD-10-CM | POA: Diagnosis not present

## 2020-01-31 DIAGNOSIS — G4733 Obstructive sleep apnea (adult) (pediatric): Secondary | ICD-10-CM | POA: Diagnosis not present

## 2020-01-31 NOTE — Patient Instructions (Signed)
Continue using CPAP nightly and greater than 4 hours each night °If your symptoms worsen or you develop new symptoms please let us know.  ° °

## 2020-01-31 NOTE — Progress Notes (Addendum)
PATIENT: Michael Parsons DOB: 1955-07-16  REASON FOR VISIT: follow up HISTORY FROM: patient  HISTORY OF PRESENT ILLNESS: Today 01/31/20:  Michael Parsons is a 64 year old male with a history of obstructive sleep apnea on CPAP.  His download indicates that he uses machine nightly for compliance of 100%.  He uses machine greater than 4 hours each night.  On average he uses his machine 7 hours and 23 minutes.  His residual AHI is 4.9 on 13 cm of water with EPR 3.  Leak in the 95th percentile is 13.8 L/min.  He reports that the CPAP is working well.  Returns today for an evaluation.  HISTORY Michael Parsons is a 64 year old right-handed gentleman with an underlying medical history of hypertension, hyperlipidemia, BPH, allergies, asthma,  reflux disease, degenerative spine disease with status post neck and lower back surgeries with some residual numbness in the right upper and lower extremities reported, and obesity, who was diagnosed with moderate obstructive sleep apnea and 1998.  He had a sleep study on 04/26/1997 with an AHI of 27/h.  He has been on CPAP therapy since 2006 and had seen Dr. Earl Gala for sleep and primary care in the past. He recently received a new CPAP machine, in Feb 2021.  I reviewed your virtual visit note from 09/21/2019.  I was able to review his compliance data from 09/03/2019 through 10/02/2019, during which time he used his machine every night with percent use days greater than 4 hours at 100%, indicating superb compliance, average usage of over 7 hours, average AHI at goal at 1.9/h, leak acceptable with a 95th percentile at 11.2 L/min on a pressure of 13 cm with EPR of 3.  He reports that when he was initially diagnosed with obstructive sleep apnea he could not tolerate the mask.  Since 2006 he has been fully compliant with CPAP therapy.  His original pressure was 11 cm.  He tolerates the 13 cm.  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  He is divorced,  he lives alone, currently his daughter is staying with him.  He has 3 children.  He typically goes to bed around 10 and rise time is around 6.  He does not have night to night nocturia and reports no recurrent morning headaches, sleeps well with his CPAP, Epworth sleepiness score is 6 out of 24, fatigue severity score is 21 out of 63.  He does watch TV in his bedroom but turns it off at night.  He has 3 dogs in the household, one of them sleeps on the floor in his bedroom at night.  He reports a family history of sleep apnea, his mother, age 71 has sleep apnea and uses a CPAP machine.  Father is 46 years old.  He drinks caffeine in the form of coffee, 4 to 5 cups/day.   REVIEW OF SYSTEMS: Out of a complete 14 system review of symptoms, the patient complains only of the following symptoms, and all other reviewed systems are negative.  FSS 20 ESS 6  ALLERGIES: Allergies  Allergen Reactions  . Sulfa Antibiotics Other (See Comments)    Unknown reaction    HOME MEDICATIONS: Outpatient Medications Prior to Visit  Medication Sig Dispense Refill  . cetirizine (ZYRTEC) 10 MG tablet Take 10 mg by mouth daily as needed for allergies.     Marland Kitchen levothyroxine (SYNTHROID, LEVOTHROID) 88 MCG tablet Take 88 mcg by mouth at bedtime.     Marland Kitchen  lisinopril-hydrochlorothiazide (PRINZIDE,ZESTORETIC) 20-12.5 MG tablet Take 1 tablet by mouth daily.    Marland Kitchen omeprazole (PRILOSEC) 20 MG capsule Take 20 mg by mouth daily.    . rosuvastatin (CRESTOR) 10 MG tablet Take 10 mg by mouth daily.    . tamsulosin (FLOMAX) 0.4 MG CAPS capsule Take 0.4 mg by mouth.     No facility-administered medications prior to visit.    PAST MEDICAL HISTORY: Past Medical History:  Diagnosis Date  . Arthritis    carpel tunnel  . Asthma    does not have to use rescue inhaler often  . Asthma   . BPH (benign prostatic hyperplasia)   . Deviated septum   . GERD (gastroesophageal reflux disease)   . Hyperlipidemia   . Hypertension   .  Hypothyroidism   . Hypothyroidism   . Sleep apnea    cpap at night    PAST SURGICAL HISTORY: Past Surgical History:  Procedure Laterality Date  . ANTERIOR CERVICAL DECOMP/DISCECTOMY FUSION N/A 11/11/2012   Procedure: ANTERIOR CERVICAL DECOMPRESSION/DISCECTOMY FUSION 2 LEVELS;  Surgeon: Tia Alert, MD;  Location: [facility];  Service: Neurosurgery;  Laterality: N/A;  Cervical five-six,Cervical six-seven  . BACK SURGERY  04/2016   lumar didcectomy  . HERNIA REPAIR  2007   umbilical    FAMILY HISTORY: No family history on file.  SOCIAL HISTORY: Social History   Socioeconomic History  . Marital status: Married    Spouse name: Not on file  . Number of children: Not on file  . Years of education: Not on file  . Highest education level: Not on file  Occupational History  . Not on file  Tobacco Use  . Smoking status: Never Smoker  . Smokeless tobacco: Never Used  Vaping Use  . Vaping Use: Never used  Substance and Sexual Activity  . Alcohol use: Yes    Alcohol/week: 1.0 - 2.0 standard drink    Types: 1 - 2 Cans of beer per week    Comment: 1-2 beers few times per week  . Drug use: No  . Sexual activity: Not on file  Other Topics Concern  . Not on file  Social History Narrative  . Not on file   Social Determinants of Health   Financial Resource Strain:   . Difficulty of Paying Living Expenses:   Food Insecurity:   . Worried About Programme researcher, broadcasting/film/video in the Last Year:   . Barista in the Last Year:   Transportation Needs:   . Freight forwarder (Medical):   Marland Kitchen Lack of Transportation (Non-Medical):   Physical Activity:   . Days of Exercise per Week:   . Minutes of Exercise per Session:   Stress:   . Feeling of Stress :   Social Connections:   . Frequency of Communication with Friends and Family:   . Frequency of Social Gatherings with Friends and Family:   . Attends Religious Services:   . Active Member of Clubs or Organizations:   . Attends Occupational hygienist Meetings:   Marland Kitchen Marital Status:   Intimate Partner Violence:   . Fear of Current or Ex-Partner:   . Emotionally Abused:   Marland Kitchen Physically Abused:   . Sexually Abused:       PHYSICAL EXAM  Vitals:   01/31/20 1136  BP: 129/78  Pulse: 72  Weight: 284 lb (128.8 kg)  Height: 6' (1.829 m)   Body mass index is 38.52 kg/m.  Generalized: Well developed, in  no acute distress  Chest: Lungs clear to auscultation bilaterally  Neurological examination  Mentation: Alert oriented to time, place, history taking. Follows all commands speech and language fluent Cranial nerve II-XII: Extraocular movements were full, visual field were full on confrontational test Head turning and shoulder shrug  were normal and symmetric. Motor: The motor testing reveals 5 over 5 strength of all 4 extremities. Good symmetric motor tone is noted throughout.  Sensory: Sensory testing is intact to soft touch on all 4 extremities. No evidence of extinction is noted.  Gait and station: Gait is normal.    DIAGNOSTIC DATA (LABS, IMAGING, TESTING) - I reviewed patient records, labs, notes, testing and imaging myself where available.  Lab Results  Component Value Date   WBC 6.8 11/11/2012   HGB 15.8 11/11/2012   HCT 45.1 11/11/2012   MCV 88.3 11/11/2012   PLT 193 11/11/2012      Component Value Date/Time   NA 140 11/11/2012 1138   K 3.8 11/11/2012 1138   CL 103 11/11/2012 1138   CO2 24 11/11/2012 1138   GLUCOSE 111 (H) 11/11/2012 1138   BUN 18 11/11/2012 1138   CREATININE 0.85 11/11/2012 1138   CALCIUM 9.3 11/11/2012 1138   GFRNONAA >90 11/11/2012 1138   GFRAA >90 11/11/2012 1138      ASSESSMENT AND PLAN 64 y.o. year old male  has a past medical history of Arthritis, Asthma, Asthma, BPH (benign prostatic hyperplasia), Deviated septum, GERD (gastroesophageal reflux disease), Hyperlipidemia, Hypertension, Hypothyroidism, Hypothyroidism, and Sleep apnea. here with:  1. OSA on CPAP  - CPAP  compliance excellent - Good treatment of AHI  - Encourage patient to use CPAP nightly and > 4 hours each night - F/U in 1 year or sooner if needed   I spent 25 minutes of face-to-face and non-face-to-face time with patient.  This included previsit chart review, lab review, study review, order entry, electronic health record documentation, patient education.  Butch Penny, MSN, NP-C 01/31/2020, 11:39 AM [facility] [address] [place], [place] [address] [phone]  I reviewed the above note and documentation by the Nurse Practitioner and agree with the history, exam, assessment and plan as outlined above. I was available for consultation. Huston Foley, MD, PhD [facility])

## 2020-05-22 ENCOUNTER — Telehealth: Payer: Self-pay | Admitting: Adult Health

## 2020-05-22 DIAGNOSIS — Z9989 Dependence on other enabling machines and devices: Secondary | ICD-10-CM

## 2020-05-22 DIAGNOSIS — G4733 Obstructive sleep apnea (adult) (pediatric): Secondary | ICD-10-CM

## 2020-05-22 NOTE — Telephone Encounter (Signed)
LMVM for pt that order for travel cpap placed,  Mail or pick up order?  Let us know.

## 2020-05-22 NOTE — Telephone Encounter (Signed)
Order placed

## 2020-05-22 NOTE — Telephone Encounter (Signed)
Pt called wanting to know if a prescription can be put in for him to be able to get a cpap machine for him to be able to take camping. Pt states that he will be paying for this one out of pocket. Please advise.

## 2020-05-23 NOTE — Telephone Encounter (Signed)
Order for travel cpap sent to [facility] via community msg. Confirmation received that the order transmitted was successful.

## 2020-07-12 ENCOUNTER — Encounter: Payer: Self-pay | Admitting: Adult Health

## 2020-07-12 NOTE — Telephone Encounter (Signed)
Did fax to [phone] [email or web address] per pt instructions.  Received confirmation.

## 2020-10-15 ENCOUNTER — Other Ambulatory Visit: Payer: Self-pay | Admitting: Orthopedic Surgery

## 2020-10-15 DIAGNOSIS — R200 Anesthesia of skin: Secondary | ICD-10-CM

## 2020-10-15 DIAGNOSIS — R2232 Localized swelling, mass and lump, left upper limb: Secondary | ICD-10-CM

## 2020-11-23 ENCOUNTER — Ambulatory Visit
Admission: RE | Admit: 2020-11-23 | Discharge: 2020-11-23 | Disposition: A | Discharge status: Home / Self Care | Payer: Medicare Other | Source: Ambulatory Visit | Attending: Orthopedic Surgery | Admitting: Orthopedic Surgery

## 2020-11-23 DIAGNOSIS — R200 Anesthesia of skin: Secondary | ICD-10-CM

## 2020-11-23 DIAGNOSIS — R2232 Localized swelling, mass and lump, left upper limb: Secondary | ICD-10-CM

## 2021-02-04 ENCOUNTER — Ambulatory Visit: Payer: Medicare Other | Admitting: Adult Health

## 2021-04-22 NOTE — Progress Notes (Signed)
PATIENT: Michael Parsons DOB: 1956/02/02  REASON FOR VISIT: follow up HISTORY FROM: patient  HISTORY OF PRESENT ILLNESS: Today 04/23/21:  Mr. Tierno is a 65 year old male with a history of obstructive sleep apnea on CPAP.  He returns today for follow-up.  He reports that the CPAP is working well.  He also has a travel machine that he uses.  He returns today for an evaluation.    01/31/20: Mr. Andal is a 65 year old male with a history of obstructive sleep apnea on CPAP.  His download indicates that he uses machine nightly for compliance of 100%.  He uses machine greater than 4 hours each night.  On average he uses his machine 7 hours and 23 minutes.  His residual AHI is 4.9 on 13 cm of water with EPR 3.  Leak in the 95th percentile is 13.8 L/min.  He reports that the CPAP is working well.  Returns today for an evaluation.  HISTORY Mr. Goeden is a 65 year old right-handed gentleman with an underlying medical history of hypertension, hyperlipidemia, BPH, allergies, asthma,  reflux disease, degenerative spine disease with status post neck and lower back surgeries with some residual numbness in the right upper and lower extremities reported, and obesity, who was diagnosed with moderate obstructive sleep apnea and 1998.  He had a sleep study on 04/26/1997 with an AHI of 27/h.  He has been on CPAP therapy since 2006 and had seen Dr. Earl Gala for sleep and primary care in the past. He recently received a new CPAP machine, in Feb 2021.  I reviewed your virtual visit note from 09/21/2019.  I was able to review his compliance data from 09/03/2019 through 10/02/2019, during which time he used his machine every night with percent use days greater than 4 hours at 100%, indicating superb compliance, average usage of over 7 hours, average AHI at goal at 1.9/h, leak acceptable with a 95th percentile at 11.2 L/min on a pressure of 13 cm with EPR of 3.  He reports that when he was initially diagnosed with  obstructive sleep apnea he could not tolerate the mask.  Since 2006 he has been fully compliant with CPAP therapy.  His original pressure was 11 cm.  He tolerates the 13 cm.  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  He is divorced, he lives alone, currently his daughter is staying with him.  He has 3 children.  He typically goes to bed around 10 and rise time is around 6.  He does not have night to night nocturia and reports no recurrent morning headaches, sleeps well with his CPAP, Epworth sleepiness score is 6 out of 24, fatigue severity score is 21 out of 63.  He does watch TV in his bedroom but turns it off at night.  He has 3 dogs in the household, one of them sleeps on the floor in his bedroom at night.  He reports a family history of sleep apnea, his mother, age 24 has sleep apnea and uses a CPAP machine.  Father is 20 years old.  He drinks caffeine in the form of coffee, 4 to 5 cups/day.    REVIEW OF SYSTEMS: Out of a complete 14 system review of symptoms, the patient complains only of the following symptoms, and all other reviewed systems are negative.  ESS 8  ALLERGIES: Allergies  Allergen Reactions   Sulfa Antibiotics Other (See Comments)    Unknown reaction    HOME MEDICATIONS:  Outpatient Medications Prior to Visit  Medication Sig Dispense Refill   cetirizine (ZYRTEC) 10 MG tablet Take 10 mg by mouth daily as needed for allergies.      levothyroxine (SYNTHROID, LEVOTHROID) 88 MCG tablet Take 88 mcg by mouth at bedtime.      lisinopril-hydrochlorothiazide (PRINZIDE,ZESTORETIC) 20-12.5 MG tablet Take 1 tablet by mouth daily.     omeprazole (PRILOSEC) 20 MG capsule Take 20 mg by mouth daily.     rosuvastatin (CRESTOR) 10 MG tablet Take 10 mg by mouth daily.     tamsulosin (FLOMAX) 0.4 MG CAPS capsule Take 0.4 mg by mouth.     No facility-administered medications prior to visit.    PAST MEDICAL HISTORY: Past Medical History:  Diagnosis Date    Arthritis    carpel tunnel   Asthma    does not have to use rescue inhaler often   Asthma    BPH (benign prostatic hyperplasia)    Deviated septum    GERD (gastroesophageal reflux disease)    Hyperlipidemia    Hypertension    Hypothyroidism    Hypothyroidism    Sleep apnea    cpap at night    PAST SURGICAL HISTORY: Past Surgical History:  Procedure Laterality Date   ANTERIOR CERVICAL DECOMP/DISCECTOMY FUSION N/A 11/11/2012   Procedure: ANTERIOR CERVICAL DECOMPRESSION/DISCECTOMY FUSION 2 LEVELS;  Surgeon: Tia Alert, MD;  Location: [facility];  Service: Neurosurgery;  Laterality: N/A;  Cervical five-six,Cervical six-seven   BACK SURGERY  04/2016   lumar didcectomy   HERNIA REPAIR  2007   umbilical    FAMILY HISTORY: Family History  Problem Relation Age of Onset   Sleep apnea Mother     SOCIAL HISTORY: Social History   Socioeconomic History   Marital status: Single    Spouse name: Not on file   Number of children: Not on file   Years of education: Not on file   Highest education level: Not on file  Occupational History   Not on file  Tobacco Use   Smoking status: Never   Smokeless tobacco: Never  Vaping Use   Vaping Use: Never used  Substance and Sexual Activity   Alcohol use: Yes    Alcohol/week: 5.0 standard drinks    Types: 1 Glasses of wine, 2 Cans of beer, 2 Shots of liquor per week   Drug use: No   Sexual activity: Not on file  Other Topics Concern   Not on file  Social History Narrative   Not on file   Social Determinants of Health   Financial Resource Strain: Not on file  Food Insecurity: Not on file  Transportation Needs: Not on file  Physical Activity: Not on file  Stress: Not on file  Social Connections: Not on file  Intimate Partner Violence: Not on file      PHYSICAL EXAM  Vitals:   04/23/21 0825  BP: 136/73  Pulse: 75  Weight: 274 lb 9.6 oz (124.6 kg)  Height: 6' (1.829 m)   Body mass index is 37.24  kg/m.  Generalized: Well developed, in no acute distress  Chest: Lungs clear to auscultation bilaterally  Neurological examination  Mentation: Alert oriented to time, place, history taking. Follows all commands speech and language fluent Cranial nerve II-XII: Extraocular movements were full, visual field were full on confrontational test Head turning and shoulder shrug  were normal and symmetric. Motor: The motor testing reveals 5 over 5 strength of all 4 extremities. Good symmetric motor tone is noted  throughout.  Sensory: Sensory testing is intact to soft touch on all 4 extremities. No evidence of extinction is noted.  Gait and station: Gait is normal.    DIAGNOSTIC DATA (LABS, IMAGING, TESTING) - I reviewed patient records, labs, notes, testing and imaging myself where available.  Lab Results  Component Value Date   WBC 6.8 11/11/2012   HGB 15.8 11/11/2012   HCT 45.1 11/11/2012   MCV 88.3 11/11/2012   PLT 193 11/11/2012      Component Value Date/Time   NA 140 11/11/2012 1138   K 3.8 11/11/2012 1138   CL 103 11/11/2012 1138   CO2 24 11/11/2012 1138   GLUCOSE 111 (H) 11/11/2012 1138   BUN 18 11/11/2012 1138   CREATININE 0.85 11/11/2012 1138   CALCIUM 9.3 11/11/2012 1138   GFRNONAA >90 11/11/2012 1138   GFRAA >90 11/11/2012 1138      ASSESSMENT AND PLAN 65 y.o. year old male  has a past medical history of Arthritis, Asthma, Asthma, BPH (benign prostatic hyperplasia), Deviated septum, GERD (gastroesophageal reflux disease), Hyperlipidemia, Hypertension, Hypothyroidism, Hypothyroidism, and Sleep apnea. here with:  OSA on CPAP  - CPAP compliance excellent - Good treatment of AHI  - Encourage patient to use CPAP nightly and > 4 hours each night - F/U in 1 year or sooner if needed   Butch Penny, MSN, NP-C 04/23/2021, 8:30 AM [facility] [address], [address] [place], [place] [address] [phone]

## 2021-04-23 ENCOUNTER — Ambulatory Visit (INDEPENDENT_AMBULATORY_CARE_PROVIDER_SITE_OTHER): Payer: Medicare Other | Admitting: Adult Health

## 2021-04-23 ENCOUNTER — Encounter: Payer: Self-pay | Admitting: Adult Health

## 2021-04-23 VITALS — BP 136/73 | HR 75 | Ht 72.0 in | Wt 274.6 lb

## 2021-04-23 DIAGNOSIS — Z9989 Dependence on other enabling machines and devices: Secondary | ICD-10-CM

## 2021-04-23 DIAGNOSIS — G4733 Obstructive sleep apnea (adult) (pediatric): Secondary | ICD-10-CM | POA: Diagnosis not present

## 2021-04-23 NOTE — Patient Instructions (Signed)
Continue using CPAP nightly and greater than 4 hours each night °If your symptoms worsen or you develop new symptoms please let us know.  ° °

## 2021-04-30 ENCOUNTER — Ambulatory Visit: Payer: Medicare Other | Admitting: Adult Health

## 2021-11-02 IMAGING — US US EXTREM UP*L* LTD
2 series · 14 of 25 positions shown · non-contrast
Comparison: None.

CLINICAL DATA: Unable to fully flex left long finger. No pain or
prior injury.

EXAM:
ULTRASOUND LEFT UPPER EXTREMITY LIMITED
TECHNIQUE: Ultrasound examination of the upper extremity soft tissues was
performed in the area of clinical concern.

[Series 1: us extrem up*left* ltd · 0.04mm/px · 7 of 17 slices shown (1 of 2)]
[im 1/17]
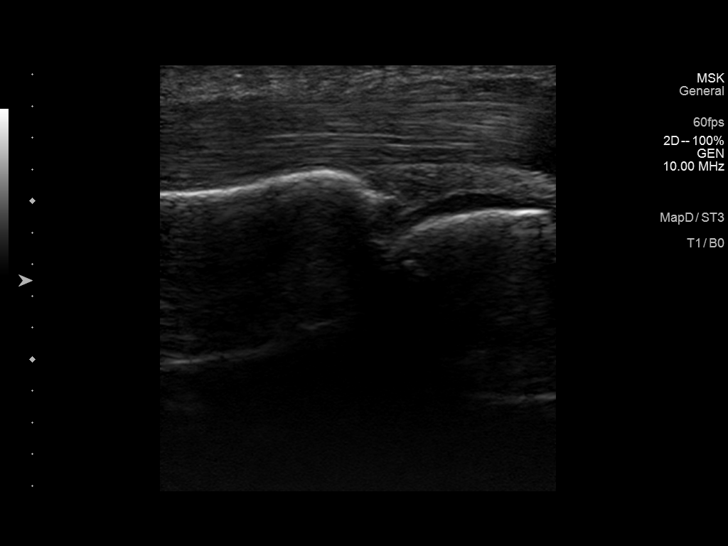
[im 3/17]
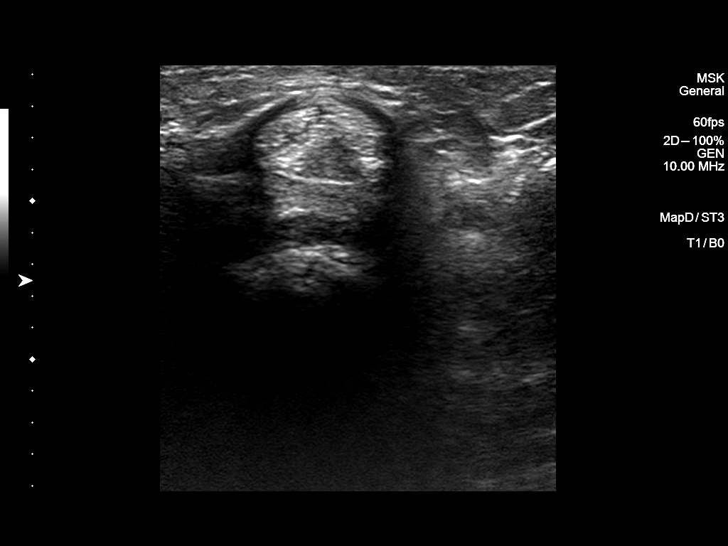
[im 6/17]
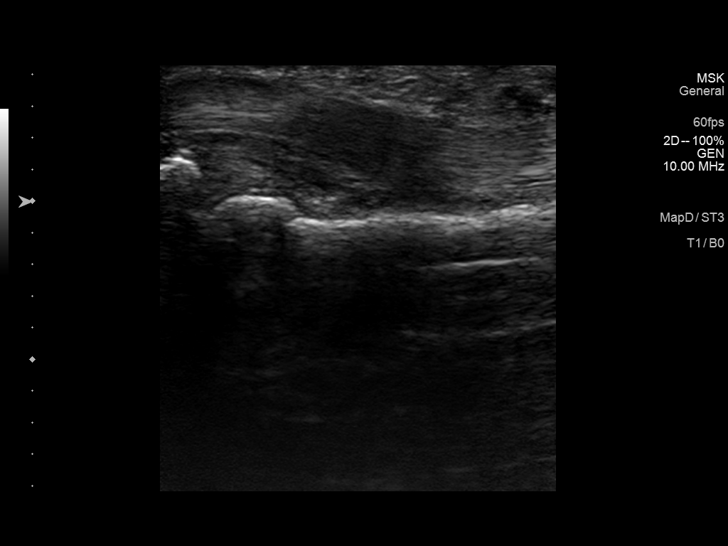
[im 9/17]
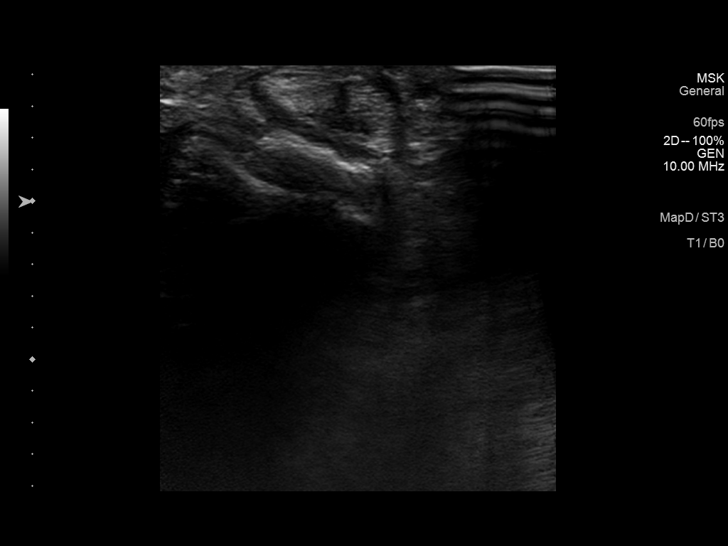
[im 11/17]
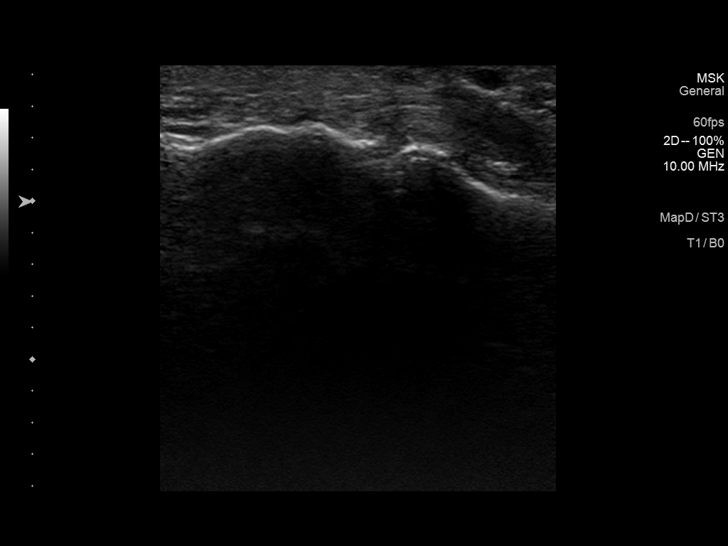
[im 13/17]
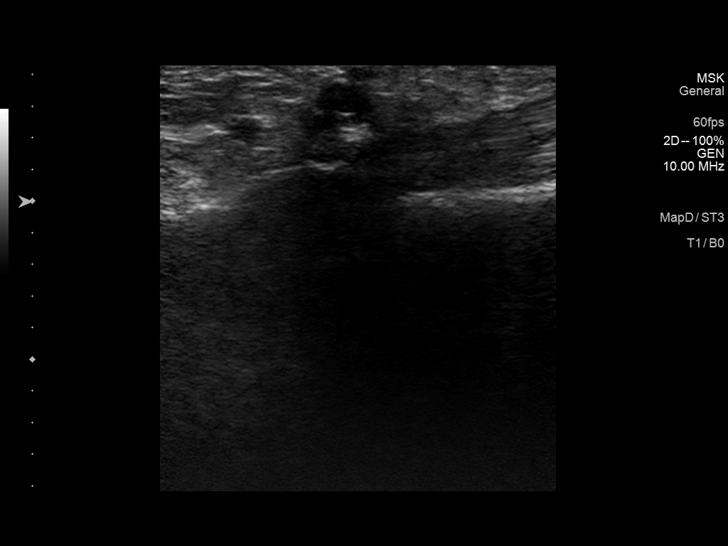
[im 15/17]
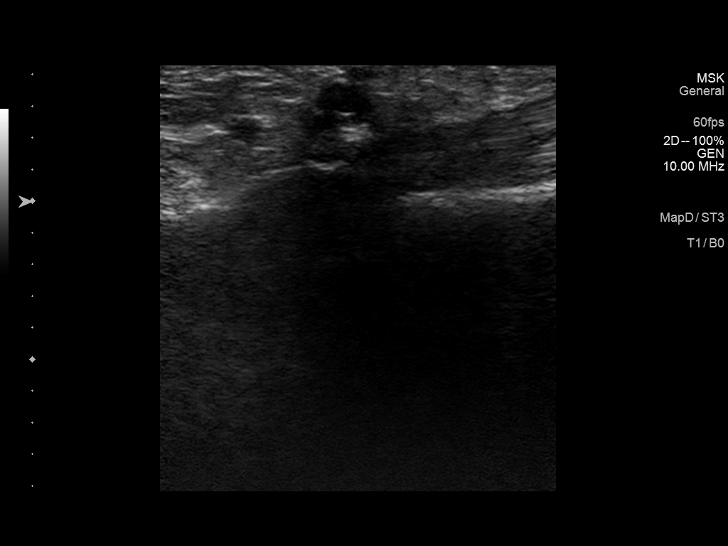

[Series 2: us extrem up*left* ltd · 0.04mm/px · 7 of 17 slices shown (2 of 2)]
[im 1/17]
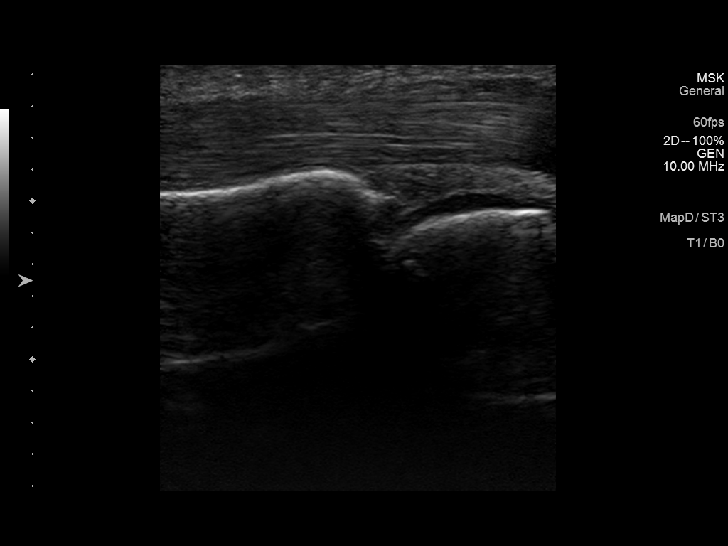
[im 3/17]
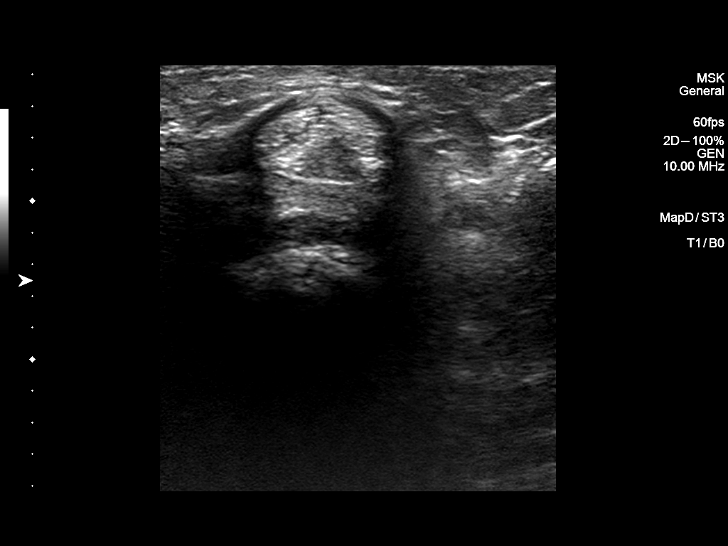
[im 5/17]
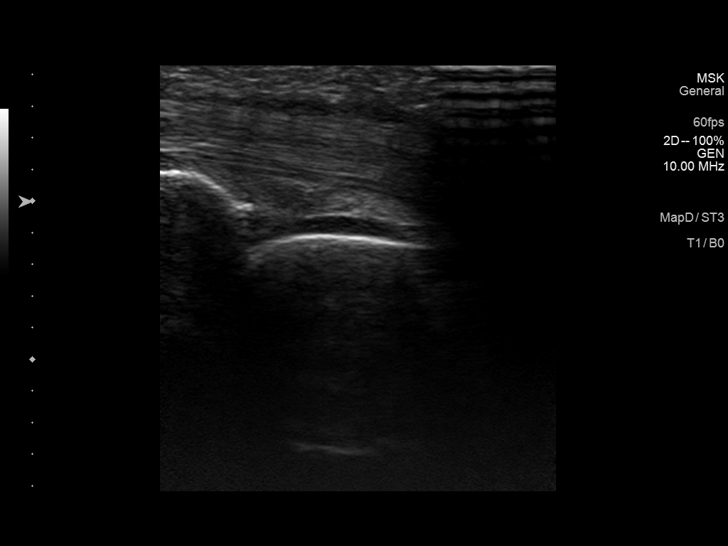
[im 8/17]
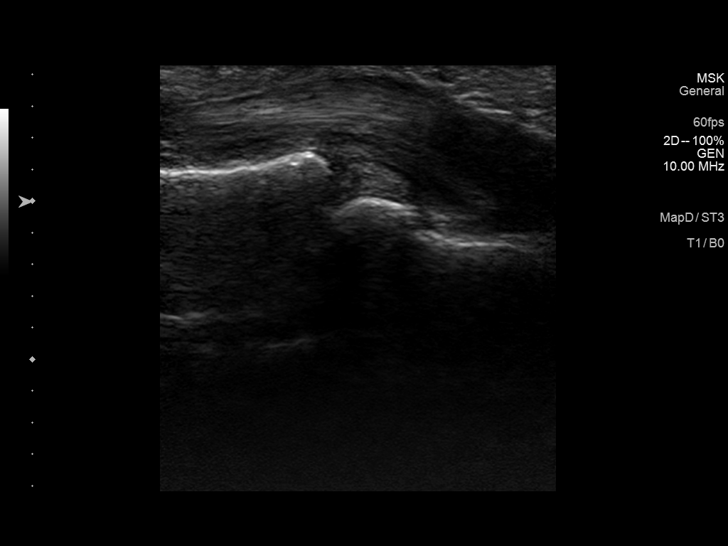
[im 11/17]
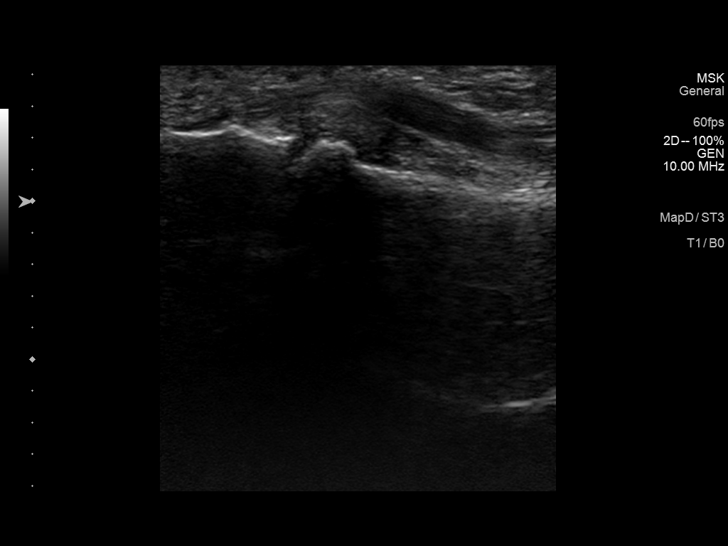
[im 14/17]
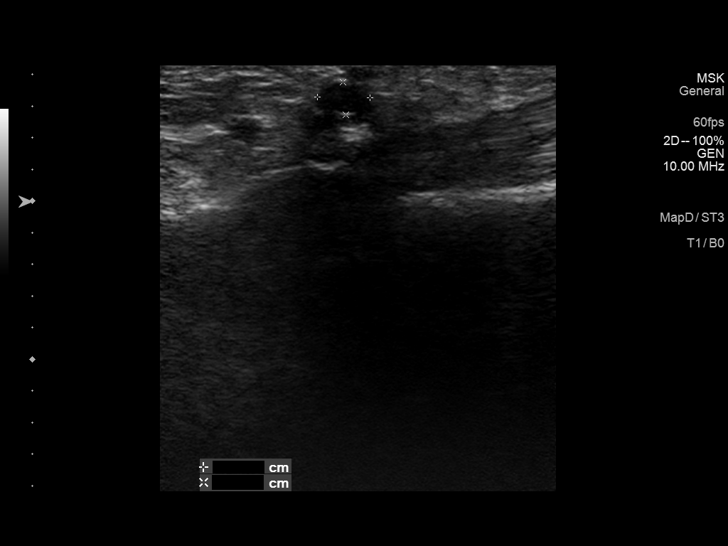
[im 17/17]
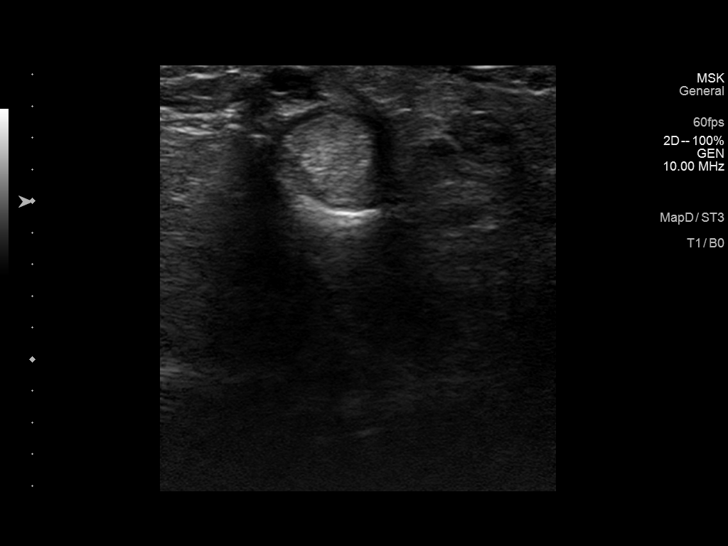

[14 of 25 positions shown; findings below may reference images not displayed]

FINDINGS: Focused ultrasound of the left long finger demonstrates intact
flexor tendons. There is thickening of the A1 pulley.

There is a well-circumscribed 3 x 2 x 3 mm anechoic lesion
associated with the radial aspect of the A2 pulley. There is no
internal vascularity.
IMPRESSION: 1. Thickening of the A1 pulley, which can be seen in the setting of
trigger finger.
2. 3 mm ganglion cyst associated with the A2 pulley.

## 2021-11-18 ENCOUNTER — Ambulatory Visit (HOSPITAL_COMMUNITY)
Admission: EM | Admit: 2021-11-18 | Discharge: 2021-11-18 | Disposition: A | Discharge status: Home / Self Care | Payer: Medicare Other | Attending: Physician Assistant | Admitting: Physician Assistant

## 2021-11-18 ENCOUNTER — Ambulatory Visit (INDEPENDENT_AMBULATORY_CARE_PROVIDER_SITE_OTHER): Payer: Medicare Other

## 2021-11-18 ENCOUNTER — Encounter (HOSPITAL_COMMUNITY): Payer: Self-pay | Admitting: Emergency Medicine

## 2021-11-18 DIAGNOSIS — J069 Acute upper respiratory infection, unspecified: Secondary | ICD-10-CM

## 2021-11-18 DIAGNOSIS — J45901 Unspecified asthma with (acute) exacerbation: Secondary | ICD-10-CM

## 2021-11-18 DIAGNOSIS — R062 Wheezing: Secondary | ICD-10-CM

## 2021-11-18 DIAGNOSIS — R059 Cough, unspecified: Secondary | ICD-10-CM

## 2021-11-18 MED ORDER — AMOXICILLIN-POT CLAVULANATE 875-125 MG PO TABS: 1.0000 | ORAL_TABLET | Freq: Two times a day (BID) | ORAL | 0 refills | Status: DC

## 2021-11-18 MED ORDER — PREDNISONE 10 MG (21) PO TBPK: ORAL_TABLET | Freq: Every day | ORAL | 0 refills | Status: DC

## 2021-11-18 NOTE — ED Triage Notes (Signed)
Cough, nasal congestion, headache, fatigue x 9 days. Reports he feels like he is producing a lot of mucus, but unable to cough it up. At home covid test negative multiple times over the last week.

## 2021-11-18 NOTE — ED Provider Notes (Signed)
[facility]    CSN: [id number] Arrival date & time: 11/18/21  1045      History   Chief Complaint Chief Complaint  Patient presents with   Cough    HPI Michael Parsons is a 66 y.o. male.   Pt complains of cough, congestion, and fatigue that started about 9 days ago.  Has a h/o asthma.  Reports "rattling" in his lungs.  Denies wheezing, shortness of breath.  He is coughing up a lot of mucus.  He denies fever, n/v.  He has used his inhaler with minimal relief, reports he feels like it made him cough up more phlegm.  He has tried mucinex as well with minimal relief.  He has taken several home covid tests over the last week, all negative results.    Past Medical History:  Diagnosis Date   Arthritis    carpel tunnel   Asthma    does not have to use rescue inhaler often   Asthma    BPH (benign prostatic hyperplasia)    Deviated septum    GERD (gastroesophageal reflux disease)    Hyperlipidemia    Hypertension    Hypothyroidism    Hypothyroidism    Sleep apnea    cpap at night    There are no problems to display for this patient.   Past Surgical History:  Procedure Laterality Date   ANTERIOR CERVICAL DECOMP/DISCECTOMY FUSION N/A 11/11/2012   Procedure: ANTERIOR CERVICAL DECOMPRESSION/DISCECTOMY FUSION 2 LEVELS;  Surgeon: Tia Alert, MD;  Location: [facility];  Service: Neurosurgery;  Laterality: N/A;  Cervical five-six,Cervical six-seven   BACK SURGERY  04/2016   lumar didcectomy   HERNIA REPAIR  2007   umbilical       Home Medications    Prior to Admission medications   Medication Sig Start Date End Date Taking? Authorizing Provider  amoxicillin-clavulanate (AUGMENTIN) 875-125 MG tablet Take 1 tablet by mouth every 12 (twelve) hours. 11/18/21  Yes Ward, Tylene Fantasia, PA-C  predniSONE (STERAPRED UNI-PAK 21 TAB) 10 MG (21) TBPK tablet Take by mouth daily. Take 6 tabs by mouth daily  for 2 days, then 5 tabs for 2 days, then 4 tabs for 2 days, then 3  tabs for 2 days, 2 tabs for 2 days, then 1 tab by mouth daily for 2 days 11/18/21  Yes Ward, Tylene Fantasia, PA-C  cetirizine (ZYRTEC) 10 MG tablet Take 10 mg by mouth daily as needed for allergies.     [provider]  levothyroxine (SYNTHROID, LEVOTHROID) 88 MCG tablet Take 88 mcg by mouth at bedtime.     [provider]  lisinopril-hydrochlorothiazide (PRINZIDE,ZESTORETIC) 20-12.5 MG tablet Take 1 tablet by mouth daily.    [provider]  omeprazole (PRILOSEC) 20 MG capsule Take 20 mg by mouth daily.    [provider]  rosuvastatin (CRESTOR) 10 MG tablet Take 10 mg by mouth daily.    [provider]  tamsulosin (FLOMAX) 0.4 MG CAPS capsule Take 0.4 mg by mouth.    [provider]    Family History Family History  Problem Relation Age of Onset   Sleep apnea Mother     Social History Social History   Tobacco Use   Smoking status: Never   Smokeless tobacco: Never  Vaping Use   Vaping Use: Never used  Substance Use Topics   Alcohol use: Yes    Alcohol/week: 5.0 standard drinks    Types: 1 Glasses of wine, 2 Cans of  beer, 2 Shots of liquor per week   Drug use: No     Allergies   Sulfa antibiotics   Review of Systems Review of Systems  Constitutional:  Positive for chills. Negative for fever.  HENT:  Positive for congestion and postnasal drip. Negative for ear pain and sore throat.   Eyes:  Negative for pain and visual disturbance.  Respiratory:  Positive for cough and chest tightness. Negative for shortness of breath.   Cardiovascular:  Negative for chest pain and palpitations.  Gastrointestinal:  Negative for abdominal pain and vomiting.  Genitourinary:  Negative for dysuria and hematuria.  Musculoskeletal:  Negative for arthralgias and back pain.  Skin:  Negative for color change and rash.  Neurological:  Negative for seizures and syncope.  All other systems reviewed and are negative.   Physical Exam Triage Vital  Signs ED Triage Vitals  Enc Vitals Group     BP 11/18/21 1225 (!) 149/80     Pulse Rate 11/18/21 1225 70     Resp 11/18/21 1225 14     Temp 11/18/21 1225 98.1 F (36.7 C)     Temp Source 11/18/21 1225 Oral     SpO2 11/18/21 1225 95 %     Weight --      Height --      Head Circumference --      Peak Flow --      Pain Score 11/18/21 1226 3     Pain Loc --      Pain Edu? --      Excl. in GC? --    No data found.  Updated Vital Signs BP (!) 149/80 (BP Location: Left Arm)   Pulse 70   Temp 98.1 F (36.7 C) (Oral)   Resp 14   SpO2 95%   Visual Acuity Right Eye Distance:   Left Eye Distance:   Bilateral Distance:    Right Eye Near:   Left Eye Near:    Bilateral Near:     Physical Exam Vitals and nursing note reviewed.  Constitutional:      General: He is not in acute distress.    Appearance: He is well-developed.  HENT:     Head: Normocephalic and atraumatic.  Eyes:     Conjunctiva/sclera: Conjunctivae normal.  Cardiovascular:     Rate and Rhythm: Normal rate and regular rhythm.     Heart sounds: No murmur heard. Pulmonary:     Effort: Pulmonary effort is normal. No respiratory distress.     Breath sounds: Rales present.  Abdominal:     Palpations: Abdomen is soft.     Tenderness: There is no abdominal tenderness.  Musculoskeletal:        General: No swelling.     Cervical back: Neck supple.  Skin:    General: Skin is warm and dry.     Capillary Refill: Capillary refill takes less than 2 seconds.  Neurological:     Mental Status: He is alert.  Psychiatric:        Mood and Affect: Mood normal.     UC Treatments / Results  Labs (all labs ordered are listed, but only abnormal results are displayed) Labs Reviewed - No data to display  EKG   Radiology DG Chest 2 View  Result Date: 11/18/2021 CLINICAL DATA:  cough, wheeze EXAM: CHEST - 2 VIEW COMPARISON:  Chest radiograph Nov 05, 2012. FINDINGS: No consolidation. No visible pleural effusions or  pneumothorax. Cardiomediastinal silhouette is similar to prior and  within normal limits. No displaced fracture. Degenerative changes of the spine. Partially imaged ACDF. IMPRESSION: No evidence of acute cardiopulmonary disease. Electronically Signed   By: Feliberto Harts M.D.   On: 11/18/2021 13:08    Procedures Procedures (including critical care time)  Medications Ordered in UC Medications - No data to display  Initial Impression / Assessment and Plan / UC Course  I have reviewed the triage vital signs and the nursing notes.  Pertinent labs & imaging results that were available during my care of the patient were reviewed by me and considered in my medical decision making (see chart for details).     Will treat for asthma exacerbation with prednisone and antibiotic.  Chest xray negative.  Supportive care discussed.  Advised to continue with mucinex, Robitussin DM, and flonase if needed.  Return precautions discussed.  PCP follow up recommended.  Final Clinical Impressions(s) / UC Diagnoses   Final diagnoses:  Viral URI with cough  Mild asthma with exacerbation, unspecified whether persistent     Discharge Instructions      Take antibiotic as prescribed Can begin prednisone dosepak as prescribed Drink plenty of fluids, rest Can continue with Mucinex  Recommend Robitussin or Delsym as needed for cough.  Follow up with PCP, return here for evaluation if needed.      ED Prescriptions     Medication Sig Dispense Auth. Provider   predniSONE (STERAPRED UNI-PAK 21 TAB) 10 MG (21) TBPK tablet Take by mouth daily. Take 6 tabs by mouth daily  for 2 days, then 5 tabs for 2 days, then 4 tabs for 2 days, then 3 tabs for 2 days, 2 tabs for 2 days, then 1 tab by mouth daily for 2 days 42 tablet Ward, Shanda Bumps Z, PA-C   amoxicillin-clavulanate (AUGMENTIN) 875-125 MG tablet Take 1 tablet by mouth every 12 (twelve) hours. 14 tablet Ward, Tylene Fantasia, PA-C      PDMP not reviewed this  encounter.   Ward, Tylene Fantasia, PA-C 11/18/21 1313

## 2021-11-18 NOTE — Discharge Instructions (Addendum)
Take antibiotic as prescribed Can begin prednisone dosepak as prescribed Drink plenty of fluids, rest Can continue with Mucinex  Recommend Robitussin or Delsym as needed for cough.  Follow up with PCP, return here for evaluation if needed.

## 2021-12-02 ENCOUNTER — Ambulatory Visit (HOSPITAL_COMMUNITY): Payer: BLUE CROSS/BLUE SHIELD

## 2021-12-26 ENCOUNTER — Ambulatory Visit (INDEPENDENT_AMBULATORY_CARE_PROVIDER_SITE_OTHER): Payer: Medicare Other | Admitting: Internal Medicine

## 2021-12-26 ENCOUNTER — Encounter: Payer: Self-pay | Admitting: Internal Medicine

## 2021-12-26 VITALS — BP 122/72 | HR 78 | Ht 72.0 in | Wt 273.8 lb

## 2021-12-26 DIAGNOSIS — G4733 Obstructive sleep apnea (adult) (pediatric): Secondary | ICD-10-CM

## 2021-12-26 DIAGNOSIS — J4540 Moderate persistent asthma, uncomplicated: Secondary | ICD-10-CM

## 2021-12-26 DIAGNOSIS — Z9989 Dependence on other enabling machines and devices: Secondary | ICD-10-CM

## 2021-12-26 DIAGNOSIS — J301 Allergic rhinitis due to pollen: Secondary | ICD-10-CM

## 2021-12-26 DIAGNOSIS — J45909 Unspecified asthma, uncomplicated: Secondary | ICD-10-CM | POA: Diagnosis not present

## 2021-12-26 DIAGNOSIS — K219 Gastro-esophageal reflux disease without esophagitis: Secondary | ICD-10-CM

## 2021-12-26 LAB — POCT EXHALED NITRIC OXIDE: FeNO level (ppb): 28

## 2021-12-26 MED ORDER — MONTELUKAST SODIUM 10 MG PO TABS: 10.0000 mg | ORAL_TABLET | Freq: Every day | ORAL | 3 refills | Status: DC

## 2021-12-26 NOTE — Progress Notes (Signed)
Michael Parsons    [id number]    1956-05-02  Primary Care Physician:Tisovec, Adelfa Koh, MD  Referring Physician: Gaspar Garbe, MD [address] [place],  [place] [address] Reason for Consultation: shortness of breath Date of Consultation: 12/26/2021  Chief complaint:   Chief Complaint  Patient presents with   Consult    Pt consult URI in May 2023 (SOB, wheezing), had a difficult time getting over it. Pt has has asthma starting at 66yo, has used inhalers (Albuterol helps).     HPI: Michael Parsons is a 66 y.o. man with past medical history of asthma in childhood which he outgrow as an adult. Here for persistent shortness of breath and wheezing after a URI in May 2023. Went to urgent care and got prednisone and antibiotics.   He had persistent symptoms and went back to his PCP and got another course of prednisone and antibiotics. He felt better after this and thought about canceling his appointment but would like to establish with a pulmonologist.  His last need for prednisone was 2-3 years ago. Usually doesn't need annually.   Also OSA on CPAP.   Current Regimen: albuterol as needed. Was given Breo but hasn't opened it or used it was very expensive.  Asthma Triggers: cats. Exercise, cold air. URIs Exacerbations in the last year: 2 History of hospitalization or intubation: Allergy Testing: yes as a child, had SCIT for about 3 years.  GERD: yes, he takes omeprazole.  Allergic Rhinitis: takes zyrtec and flonase - has ongoing symptoms despite this ACT:      No data to display         FeNO:  Social history:  Occupation: worked in Consulting civil engineer, retired now.  Exposures: lives at home independently.  Smoking history: never smoker, no passive smoke exposure  Social History   Occupational History   Not on file  Tobacco Use   Smoking status: Never   Smokeless tobacco: Never  Vaping Use   Vaping Use: Never used  Substance and Sexual Activity   Alcohol use: Yes     Alcohol/week: 5.0 standard drinks of alcohol    Types: 1 Glasses of wine, 2 Cans of beer, 2 Shots of liquor per week   Drug use: No   Sexual activity: Not on file    Relevant family history:  Family History  Problem Relation Age of Onset   Sleep apnea Mother    Asthma Mother     Past Medical History:  Diagnosis Date   Arthritis    carpel tunnel   Asthma    does not have to use rescue inhaler often   Asthma    BPH (benign prostatic hyperplasia)    Deviated septum    GERD (gastroesophageal reflux disease)    Hyperlipidemia    Hypertension    Hypothyroidism    Hypothyroidism    Sleep apnea    cpap at night    Past Surgical History:  Procedure Laterality Date   ANTERIOR CERVICAL DECOMP/DISCECTOMY FUSION N/A 11/11/2012   Procedure: ANTERIOR CERVICAL DECOMPRESSION/DISCECTOMY FUSION 2 LEVELS;  Surgeon: Tia Alert, MD;  Location: [facility];  Service: Neurosurgery;  Laterality: N/A;  Cervical five-six,Cervical six-seven   BACK SURGERY  04/2016   lumar didcectomy   HERNIA REPAIR  2007   umbilical     Physical Exam: Blood pressure 122/72, pulse 78, height 6' (1.829 m), weight 273 lb 12.8 oz (124.2 kg), SpO2 96 %. Gen:  No acute distress ENT:  deviated septum to the left, no nasal polyps, mucus membranes moist Lungs:    No increased respiratory effort, symmetric chest wall excursion, clear to auscultation bilaterally, no wheezes or crackles CV:         Regular rate and rhythm; no murmurs, rubs, or gallops.  No pedal edema Abd:      + bowel sounds; soft, non-tender; no distension MSK: no acute synovitis of DIP or PIP joints, no mechanics hands.  Skin:      Warm and dry; no rashes Neuro: normal speech, no focal facial asymmetry Psych: alert and oriented x3, normal mood and affect   Data Reviewed/Medical Decision Making:  Independent interpretation of tests: Imaging:  Review of patient's chest xray images revealed no acute . The patient's images have been  independently reviewed by me.    PFTs: Spirometry obtained 12/26/21 was normal Feno 28 ppb   Labs:  Lab Results  Component Value Date   WBC 6.8 11/11/2012   HGB 15.8 11/11/2012   HCT 45.1 11/11/2012   MCV 88.3 11/11/2012   PLT 193 11/11/2012   Lab Results  Component Value Date   NA 140 11/11/2012   K 3.8 11/11/2012   CL 103 11/11/2012   CO2 24 11/11/2012   No peripheral eosinophilia.   Immunization status:   There is no immunization history on file for this patient.   I reviewed prior external note(s) from PCP  I reviewed the result(s) of the labs and imaging as noted above.   I have ordered spiro and feno  Assessment:  Moderate persistent asthma with recent exacerbation.  OSA on CPAP Allergic Rhinitis GERD  Plan/Recommendations: Obtained spiro and feno today wnl.  Start taking intermittent Breo inhaler before and after trigger exposure such as poor air quality, URIs, known exposures to cats etc.   1 puff once a day, gargle after use.   Continue flonase and zyrtec. We will add montelukast allergy medicine once daily to your allergy regimen.   Continue PPI for GERD   We discussed disease management and progression at length today.     Return to Care: Return in about 4 months (around 04/28/2022).  Durel Salts, MD Pulmonary and Critical Care Medicine [facility] [phone]  CC: Tisovec, Adelfa Koh, MD

## 2021-12-26 NOTE — Patient Instructions (Addendum)
Please schedule follow up scheduled with myself in 4 months.  If my schedule is not open yet, we will contact you with a reminder closer to that time. Please call [phone] if you haven't heard from [place] a month before.    Start taking Breo inhaler before and after trigger exposure such as poor air quality, URIs, known exposures to cats etc.  Sometimes the inhalers we prescribe are either not covered or are too expensive.  In most cases your doctor can substitute an alternate inhaler which is covered under your insurance formulary.  Please call your insurance company to find out which inhaler is covered and let us know, and we can prescribe an alternative.  1 puff once a day, gargle after use.   Continue flonase and zyrtec. We will add montelukast allergy medicine once daily to your allergy regimen.  Flonase - 1 spray on each side of your nose twice a day for first week, then 1 spray on each side.   Instructions for use: If you also use a saline nasal spray or rinse, use that first. Position the head with the chin slightly tucked. Use the right hand to spray into the left nostril and the right hand to spray into the left nostril.   Point the bottle away from the septum of your nose (cartilage that divides the two sides of your nose).  Hold the nostril closed on the opposite side from where you will spray Spray once and gently sniff to pull the medicine into the higher parts of your nose.  Don't sniff too hard as the medicine will drain down the back of your throat instead. Repeat with a second spray on the same side if prescribed. Repeat on the other side of your nose.  By learning about asthma and how it can be controlled, you take an important step toward managing this disease. Work closely with your asthma care team to learn all you can about your asthma, how to avoid triggers, what your medications do, and how to take them correctly. With proper care, you can live free of asthma symptoms and  maintain a normal, healthy lifestyle.   What is asthma? Asthma is a chronic disease that affects the airways of the lungs. During normal breathing, the bands of muscle that surround the airways are relaxed and air moves freely. During an asthma episode or "attack," there are three main changes that stop air from moving easily through the airways: The bands of muscle that surround the airways tighten and make the airways narrow. This tightening is called bronchospasm.  The lining of the airways becomes swollen or inflamed.  The cells that line the airways produce more mucus, which is thicker than normal and clogs the airways.  These three factors - bronchospasm, inflammation, and mucus production - cause symptoms such as difficulty breathing, wheezing, and coughing.  What are the most common symptoms of asthma? Asthma symptoms are not the same for everyone. They can even change from episode to episode in the same person. Also, you may have only one symptom of asthma, such as cough, but another person may have all the symptoms of asthma. It is important to know all the symptoms of asthma and to be aware that your asthma can present in any of these ways at any time. The most common symptoms include: Coughing, especially at night  Shortness of breath  Wheezing  Chest tightness, pain, or pressure   Who is affected by asthma? Asthma affects 22  million Americans; about 6 million of these are children under age 78. People who have a family history of asthma have an increased risk of developing the disease. Asthma is also more common in people who have allergies or who are exposed to tobacco smoke. However, anyone can develop asthma at any time. Some people may have asthma all of their lives, while others may develop it as adults.  What causes asthma? The airways in a person with asthma are very sensitive and react to many things, or "triggers." Contact with these triggers causes asthma symptoms. One of  the most important parts of asthma control is to identify your triggers and then avoid them when possible. The only trigger you do not want to avoid is exercise. Pre-treatment with medicines before exercise can allow you to stay active yet avoid asthma symptoms. Common asthma triggers include: Infections (colds, viruses, flu, sinus infections)  Exercise  Weather (changes in temperature and/or humidity, cold air)  Tobacco smoke  Allergens (dust mites, pollens, pets, mold spores, cockroaches, and sometimes foods)  Irritants (strong odors from cleaning products, perfume, wood smoke, air pollution)  Strong emotions such as crying or laughing hard  Some medications   How is asthma diagnosed? To diagnose asthma, your doctor will first review your medical history, family history, and symptoms. Your doctor will want to know any past history of breathing problems you may have had, as well as a family history of asthma, allergies, eczema (a bumpy, itchy skin rash caused by allergies), or other lung disease. It is important that you describe your symptoms in detail (cough, wheeze, shortness of breath, chest tightness), including when and how often they occur. The doctor will perform a physical examination and listen to your heart and lungs. He or she may also order breathing tests, allergy tests, blood tests, and chest and sinus X-rays. The tests will find out if you do have asthma and if there are any other conditions that are contributing factors.  How is asthma treated? Asthma can be controlled, but not cured. It is not normal to have frequent symptoms, trouble sleeping, or trouble completing tasks. Appropriate asthma care will prevent symptoms and visits to the emergency room and hospital. Asthma medicines are one of the mainstays of asthma treatment. The drugs used to treat asthma are explained below.  Anti-inflammatories: These are the most important drugs for most people with asthma. Anti-inflammatory  drugs reduce swelling and mucus production in the airways. As a result, airways are less sensitive and less likely to react to triggers. These medications need to be taken daily and may need to be taken for several weeks before they begin to control asthma. Anti-inflammatory medicines lead to fewer symptoms, better airflow, less sensitive airways, less airway damage, and fewer asthma attacks. If taken every day, they CONTROL or prevent asthma symptoms.   Bronchodilators: These drugs relax the muscle bands that tighten around the airways. This action opens the airways, letting more air in and out of the lungs and improving breathing. Bronchodilators also help clear mucus from the lungs. As the airways open, the mucus moves more freely and can be coughed out more easily. In short-acting forms, bronchodilators RELIEVE or stop asthma symptoms by quickly opening the airways and are very helpful during an asthma episode. In long-acting forms, bronchodilators provide CONTROL of asthma symptoms and prevent asthma episodes.  Asthma drugs can be taken in a variety of ways. Inhaling the medications by using a metered dose inhaler, dry powder inhaler, or  nebulizer is one way of taking asthma medicines. Oral medicines (pills or liquids you swallow) may also be prescribed.  Asthma severity Asthma is classified as either "intermittent" (comes and goes) or "persistent" (lasting). Persistent asthma is further described as being mild, moderate, or severe. The severity of asthma is based on how often you have symptoms both during the day and night, as well as by the results of lung function tests and by how well you can perform activities. The "severity" of asthma refers to how "intense" or "strong" your asthma is.  Asthma control Asthma control is the goal of asthma treatment. Regardless of your asthma severity, it may or may not be controlled. Asthma control means: You are able to do everything you want to do at work and  home  You have no (or minimal) asthma symptoms  You do not wake up from your sleep or earlier than usual in the morning due to asthma  You rarely need to use your reliever medicine (inhaler)  Another major part of your treatment is that you are happy with your asthma care and believe your asthma is controlled.  Monitoring symptoms A key part of treatment is keeping track of how well your lungs are working. Monitoring your symptoms, what they are, how and when they happen, and how severe they are, is an important part of being able to control your asthma.  Sometimes asthma is monitored using a peak flow meter. A peak flow (PF) meter measures how fast the air comes out of your lungs. It can help you know when your asthma is getting worse, sometimes even before you have symptoms. By taking daily peak flow readings, you can learn when to adjust medications to keep asthma under good control. It is also used to create your asthma action plan (see below). Your doctor can use your peak flow readings to adjust your treatment plan in some cases.  Asthma Action Plan Based on your history and asthma severity, you and your doctor will develop a care plan called an "asthma action plan." The asthma action plan describes when and how to use your medicines, actions to take when asthma worsens, and when to seek emergency care. Make sure you understand this plan. If you do not, ask your asthma care provider any questions you may have. Your asthma action plan is one of the keys to controlling asthma. Keep it readily available to remind you of what you need to do every day to control asthma and what you need to do when symptoms occur.  Goals of asthma therapy These are the goals of asthma treatment: Live an active, normal life  Prevent chronic and troublesome symptoms  Attend work or school every day  Perform daily activities without difficulty  Stop urgent visits to the doctor, emergency department, or hospital   Use and adjust medications to control asthma with few or no side effects

## 2022-04-22 NOTE — Progress Notes (Unsigned)
PATIENT: SAHAJ RAJ DOB: 14-Sep-1955  REASON FOR VISIT: follow up HISTORY FROM: patient  HISTORY OF PRESENT ILLNESS: Today 04/22/22:   04/23/2021: Mr. Delmore is a 66 year old male with a history of obstructive sleep apnea on CPAP.  He returns today for follow-up.  He reports that the CPAP is working well.  He also has a travel machine that he uses.  He returns today for an evaluation.    01/31/20: Mr. Gillman is a 66 year old male with a history of obstructive sleep apnea on CPAP.  His download indicates that he uses machine nightly for compliance of 100%.  He uses machine greater than 4 hours each night.  On average he uses his machine 7 hours and 23 minutes.  His residual AHI is 4.9 on 13 cm of water with EPR 3.  Leak in the 95th percentile is 13.8 L/min.  He reports that the CPAP is working well.  Returns today for an evaluation.  HISTORY Mr. 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  He had a sleep study on 04/26/1997 with an AHI of 27/h.  He has been on CPAP therapy since 2006 and had seen Dr. Maxwell Caul for sleep and primary care in the past. He recently received a new CPAP machine, in Feb 2021.  I reviewed your virtual visit note from 09/21/2019.  I was able to review his compliance data from 09/03/2019 through 10/02/2019, during which time he used his machine every night with percent use days greater than 4 hours at 100%, indicating superb compliance, average usage of over 7 hours, average AHI at goal at 1.9/h, leak acceptable with a 95th percentile at 11.2 L/min on a pressure of 13 cm with EPR of 3.  He reports that when he was initially  diagnosed with obstructive sleep apnea he could not tolerate the mask.  Since 2006 he has been fully compliant with CPAP therapy.  His original pressure was 11 cm.  He tolerates the 13 cm.  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  He is divorced, he lives alone, currently his daughter is staying with him.  He has 3 children.  He typically goes to bed around 10 and rise time is around 6.  He does not have night to night nocturia and reports no recurrent morning headaches, sleeps well with his CPAP, Epworth sleepiness score is 6 out of 24, fatigue severity score is 21 out of 63.  He does watch TV in his bedroom but turns it off at night.  He has 3 dogs in the household, one of them sleeps on the floor in his bedroom at night.  He reports a family history of sleep apnea, his mother, age 90 has sleep apnea and uses a CPAP machine.  Father is 39 years old.  He drinks caffeine in the form of coffee, 4 to 5 cups/day.    REVIEW OF SYSTEMS: Out of a complete 14 system review of symptoms, the patient complains only of the following symptoms, and all other reviewed systems are negative.  ESS 8  ALLERGIES: Allergies  Allergen Reactions   Sulfa Antibiotics Other (See Comments)    Unknown reaction  HOME MEDICATIONS: Outpatient Medications Prior to Visit  Medication Sig Dispense Refill   cetirizine (ZYRTEC) 10 MG tablet Take 10 mg by mouth daily as needed for allergies.      levothyroxine (SYNTHROID, LEVOTHROID) 88 MCG tablet Take 88 mcg by mouth at bedtime.      lisinopril-hydrochlorothiazide (PRINZIDE,ZESTORETIC) 20-12.5 MG tablet Take 1 tablet by mouth daily.     montelukast (SINGULAIR) 10 MG tablet Take 1 tablet (10 mg total) by mouth at bedtime. 90 tablet 3   omeprazole (PRILOSEC) 20 MG capsule Take 20 mg by mouth daily.     rosuvastatin (CRESTOR) 10 MG tablet Take 10 mg by mouth daily.     No facility-administered medications prior to visit.    PAST MEDICAL  HISTORY: Past Medical History:  Diagnosis Date   Arthritis    carpel tunnel   Asthma    does not have to use rescue inhaler often   Asthma    BPH (benign prostatic hyperplasia)    Deviated septum    GERD (gastroesophageal reflux disease)    Hyperlipidemia    Hypertension    Hypothyroidism    Hypothyroidism    Sleep apnea    cpap at night    PAST SURGICAL HISTORY: Past Surgical History:  Procedure Laterality Date   ANTERIOR CERVICAL DECOMP/DISCECTOMY FUSION N/A 11/11/2012   Procedure: ANTERIOR CERVICAL DECOMPRESSION/DISCECTOMY FUSION 2 LEVELS;  Surgeon: Eustace Moore, MD;  Location: [facility];  Service: Neurosurgery;  Laterality: N/A;  Cervical five-six,Cervical six-seven   BACK SURGERY  04/2016   lumar didcectomy   HERNIA REPAIR  [id number]   umbilical    FAMILY HISTORY: Family History  Problem Relation Age of Onset   Sleep apnea Mother    Asthma Mother     SOCIAL HISTORY: Social History   Socioeconomic History   Marital status: Single    Spouse name: Not on file   Number of children: Not on file   Years of education: Not on file   Highest education level: Not on file  Occupational History   Not on file  Tobacco Use   Smoking status: Never   Smokeless tobacco: Never  Vaping Use   Vaping Use: Never used  Substance and Sexual Activity   Alcohol use: Yes    Alcohol/week: 5.0 standard drinks of alcohol    Types: 1 Glasses of wine, 2 Cans of beer, 2 Shots of liquor per week   Drug use: No   Sexual activity: Not on file  Other Topics Concern   Not on file  Social History Narrative   Not on file   Social Determinants of Health   Financial Resource Strain: Not on file  Food Insecurity: Not on file  Transportation Needs: Not on file  Physical Activity: Not on file  Stress: Not on file  Social Connections: Not on file  Intimate Partner Violence: Not on file      PHYSICAL EXAM  There were no vitals filed for this visit.  There is no height or weight  on file to calculate BMI.  Generalized: Well developed, in no acute distress  Chest: Lungs clear to auscultation bilaterally  Neurological examination  Mentation: Alert oriented to time, place, history taking. Follows all commands speech and language fluent Cranial nerve II-XII: Extraocular movements were full, visual field were full on confrontational test Head turning and shoulder shrug  were normal and symmetric. Motor: The motor testing reveals 5 over 5 strength of all 4 extremities. Good symmetric motor tone  is noted throughout.  Sensory: Sensory testing is intact to soft touch on all 4 extremities. No evidence of extinction is noted.  Gait and station: Gait is normal.    DIAGNOSTIC DATA (LABS, IMAGING, TESTING) - I reviewed patient records, labs, notes, testing and imaging myself where available.  Lab Results  Component Value Date   WBC 6.8 11/11/2012   HGB 15.8 11/11/2012   HCT 45.1 11/11/2012   MCV 88.3 11/11/2012   PLT 193 11/11/2012      Component Value Date/Time   NA 140 11/11/2012 1138   K 3.8 11/11/2012 1138   CL 103 11/11/2012 1138   CO2 24 11/11/2012 1138   GLUCOSE 111 (H) 11/11/2012 1138   BUN 18 11/11/2012 1138   CREATININE 0.85 11/11/2012 1138   CALCIUM 9.3 11/11/2012 1138   GFRNONAA >90 11/11/2012 1138   GFRAA >90 11/11/2012 1138      ASSESSMENT AND PLAN 66 y.o. year old male  has a past medical history of Arthritis, Asthma, Asthma, BPH (benign prostatic hyperplasia), Deviated septum, GERD (gastroesophageal reflux disease), Hyperlipidemia, Hypertension, Hypothyroidism, Hypothyroidism, and Sleep apnea. here with:  OSA on CPAP  - CPAP compliance excellent - Good treatment of AHI  - Encourage patient to use CPAP nightly and > 4 hours each night - F/U in 1 year or sooner if needed   Ward Givens, MSN, NP-C 04/22/2022, 3:32 PM [facility] [address] [place], [place] [address] [phone]

## 2022-04-23 ENCOUNTER — Ambulatory Visit (INDEPENDENT_AMBULATORY_CARE_PROVIDER_SITE_OTHER): Payer: Medicare Other | Admitting: Adult Health

## 2022-04-23 ENCOUNTER — Encounter: Payer: Self-pay | Admitting: Adult Health

## 2022-04-23 VITALS — BP 114/69 | HR 71 | Ht 72.0 in | Wt 277.6 lb

## 2022-04-23 DIAGNOSIS — G4733 Obstructive sleep apnea (adult) (pediatric): Secondary | ICD-10-CM | POA: Diagnosis not present

## 2022-06-02 ENCOUNTER — Encounter: Payer: Self-pay | Admitting: Nurse Practitioner

## 2022-06-02 ENCOUNTER — Ambulatory Visit (INDEPENDENT_AMBULATORY_CARE_PROVIDER_SITE_OTHER): Payer: Medicare Other | Admitting: Nurse Practitioner

## 2022-06-02 VITALS — BP 120/74 | HR 86 | Ht 72.0 in | Wt 281.6 lb

## 2022-06-02 DIAGNOSIS — J4520 Mild intermittent asthma, uncomplicated: Secondary | ICD-10-CM

## 2022-06-02 NOTE — Assessment & Plan Note (Signed)
Well-controlled on current regimen. Exacerbation from May related to URI; treated with two rounds of steroids/abx. He has not had any flares since. He has not required ICS/LABA therapy since prescribed in July. He will continue montelukast nightly for trigger prevention. Understands appropriate time to utilize inhaler therapies. Asthma action plan in place.   Patient Instructions  Continue Albuterol inhaler 2 puffs every 6 hours as needed for shortness of breath or wheezing. Notify if symptoms persist despite rescue inhaler/neb use. Continue Breo 1 puff daily as needed for trigger exposure or respiratory illness. Brush tongue and rinse mouth afterwards  Continue montelukast 1 tab At bedtime  Continue cetirizine 1 tab daily for allergies   Follow up in one year with Dr. Celine Mans. If symptoms worsen, please contact office for sooner follow up or seek emergency care.

## 2022-06-02 NOTE — Patient Instructions (Addendum)
Continue Albuterol inhaler 2 puffs every 6 hours as needed for shortness of breath or wheezing. Notify if symptoms persist despite rescue inhaler/neb use. Continue Breo 1 puff daily as needed for trigger exposure or respiratory illness. Brush tongue and rinse mouth afterwards  Continue montelukast 1 tab At bedtime  Continue cetirizine 1 tab daily for allergies   Follow up in one year with Dr. Celine Mans. If symptoms worsen, please contact office for sooner follow up or seek emergency care.

## 2022-06-02 NOTE — Progress Notes (Signed)
@Patient  ID: , male    DOB: December 07, 1955, 66 y.o.   MRN: [id number]  Chief Complaint  Patient presents with   Follow-up    Pt f/u he reports he is feeling well    Referring provider: Tisovec, [id number], MD  HPI: 66 year old male, never smoker followed for mild asthma. He is a patient of Dr. 79 and last seen in office 12/26/2021. Past medical history significant for HTN, OSA on CPAP, GERD, hypothyroid, HLD.  TEST/EVENTS:  12/26/2021 spirometry normal; FeNO 28 ppb   12/26/2021: OV with Dr. 02/26/2022 for initial pulmonary consult. Asthma in childhood; outgrew as an adult. Here for persistent SOB and wheezing after URI in May 2023. Went to UC and got prednisone and abx. He had persistent symptoms so went back to his PCP and received another course of prednisone and abx. Felt better after this and thought about canceling his appt but would like to establish with a pulmonologist. Last use for prednisone prior to this was 2-3 years ago. Intermittent Breo prescribed for trigger exposure, URIs, etc. Add on sinuglair to regimen. Continue flonase and zyrtec.   06/02/2022: Today - follow up Patient presents today for follow up. He has been doing well since he was here last. He has not had to use Breo at all. Feels like his breathing has been back to his baseline since he saw Dr. 14/04/2022. He denies any cough, chest congestion, or wheezing. He is using singulair nightly. Has a rescue inhaler available but hasn't had to use it. He's able to complete daily activities without any difficulties. No concerns or complaints today.   Allergies  Allergen Reactions   Sulfa Antibiotics Other (See Comments)    Unknown reaction     There is no immunization history on file for this patient.  Past Medical History:  Diagnosis Date   Arthritis    carpel tunnel   Asthma    does not have to use rescue inhaler often   Asthma    BPH (benign prostatic hyperplasia)    Deviated septum    GERD  (gastroesophageal reflux disease)    Hyperlipidemia    Hypertension    Hypothyroidism    Hypothyroidism    Sleep apnea    cpap at night    Tobacco History: Social History   Tobacco Use  Smoking Status Never  Smokeless Tobacco Never   Counseling given: Not Answered   Outpatient Medications Prior to Visit  Medication Sig Dispense Refill   albuterol (VENTOLIN HFA) 108 (90 Base) MCG/ACT inhaler 2 puffs Inhalation 4x a day PRN for shortness of breath     BREO ELLIPTA 100-25 MCG/ACT AEPB 1 puff Inhalation Once a day     cetirizine (ZYRTEC) 10 MG tablet Take 10 mg by mouth daily as needed for allergies.      levothyroxine (SYNTHROID, LEVOTHROID) 88 MCG tablet Take 88 mcg by mouth at bedtime.      lisinopril-hydrochlorothiazide (PRINZIDE,ZESTORETIC) 20-12.5 MG tablet Take 1 tablet by mouth daily.     montelukast (SINGULAIR) 10 MG tablet Take 1 tablet (10 mg total) by mouth at bedtime. 90 tablet 3   omeprazole (PRILOSEC) 20 MG capsule Take 20 mg by mouth daily.     rosuvastatin (CRESTOR) 10 MG tablet Take 10 mg by mouth daily.     No facility-administered medications prior to visit.     Review of Systems:   Constitutional: No weight loss or gain, night sweats, fevers, chills, fatigue, or lassitude. HEENT:  No headaches, difficulty swallowing, tooth/dental problems, or sore throat. No sneezing, itching, ear ache, nasal congestion, or post nasal drip CV:  No chest pain, orthopnea, PND, swelling in lower extremities, anasarca, dizziness, palpitations, syncope Resp: No shortness of breath with exertion or at rest. No excess mucus or change in color of mucus. No productive or non-productive. No hemoptysis. No wheezing.  No chest wall deformity GI:  No heartburn, indigestion, abdominal pain, nausea, vomiting, diarrhea, change in bowel habits, loss of appetite, bloody stools.  GU: No dysuria, change in color of urine, urgency or frequency.  No flank pain, no hematuria  Skin: No rash,  lesions, ulcerations MSK:  No joint pain or swelling.  No decreased range of motion.  No back pain. Neuro: No dizziness or lightheadedness.  Psych: No depression or anxiety. Mood stable.     Physical Exam:  BP 120/74   Pulse 86   Ht 6' (1.829 m)   Wt 281 lb 9.6 oz (127.7 kg)   SpO2 98%   BMI 38.19 kg/m   GEN: Pleasant, interactive, well-appearing; obese; in no acute distress HEENT:  Normocephalic and atraumatic. PERRLA. Sclera white. Nasal turbinates pink, moist and patent bilaterally. No rhinorrhea present. Oropharynx pink and moist, without exudate or edema. No lesions, ulcerations, or postnasal drip.  NECK:  Supple w/ fair ROM. No JVD present. Normal carotid impulses w/o bruits. Thyroid symmetrical with no goiter or nodules palpated. No lymphadenopathy.   CV: RRR, no m/r/g, no peripheral edema. Pulses intact, +2 bilaterally. No cyanosis, pallor or clubbing. PULMONARY:  Unlabored, regular breathing. Clear bilaterally A&P w/o wheezes/rales/rhonchi. No accessory muscle use.  GI: BS present and normoactive. Soft, non-tender to palpation. No organomegaly or masses detected.  MSK: No erythema, warmth or tenderness. Cap refil <2 sec all extrem. No deformities or joint swelling noted.  Neuro: A/Ox3. No focal deficits noted.   Skin: Warm, no lesions or rashe Psych: Normal affect and behavior. Judgement and thought content appropriate.     Lab Results:  CBC    Component Value Date/Time   WBC 6.8 11/11/2012 1138   RBC 5.11 11/11/2012 1138   HGB 15.8 11/11/2012 1138   HCT 45.1 11/11/2012 1138   PLT 193 11/11/2012 1138   MCV 88.3 11/11/2012 1138   MCH 30.9 11/11/2012 1138   MCHC 35.0 11/11/2012 1138   RDW 13.7 11/11/2012 1138   LYMPHSABS 1.1 11/11/2012 1138   MONOABS 0.5 11/11/2012 1138   EOSABS 0.1 11/11/2012 1138   BASOSABS 0.0 11/11/2012 1138    BMET    Component Value Date/Time   NA 140 11/11/2012 1138   K 3.8 11/11/2012 1138   CL 103 11/11/2012 1138   CO2 24  11/11/2012 1138   GLUCOSE 111 (H) 11/11/2012 1138   BUN 18 11/11/2012 1138   CREATININE 0.85 11/11/2012 1138   CALCIUM 9.3 11/11/2012 1138   GFRNONAA >90 11/11/2012 1138   GFRAA >90 11/11/2012 1138    BNP No results found for: "BNP"   Imaging:  No results found.        No data to display          No results found for: "NITRICOXIDE"      Assessment & Plan:   Mild intermittent asthma Well-controlled on current regimen. Exacerbation from May related to URI; treated with two rounds of steroids/abx. He has not had any flares since. He has not required ICS/LABA therapy since prescribed in July. He will continue montelukast nightly for trigger prevention. Understands appropriate time to  utilize inhaler therapies. Asthma action plan in place.   Patient Instructions  Continue Albuterol inhaler 2 puffs every 6 hours as needed for shortness of breath or wheezing. Notify if symptoms persist despite rescue inhaler/neb use. Continue Breo 1 puff daily as needed for trigger exposure or respiratory illness. Brush tongue and rinse mouth afterwards  Continue montelukast 1 tab At bedtime  Continue cetirizine 1 tab daily for allergies   Follow up in one year with Dr. Celine Mans. If symptoms worsen, please contact office for sooner follow up or seek emergency care.    I spent 28 minutes of dedicated to the care of this patient on the date of this encounter to include pre-visit review of records, face-to-face time with the patient discussing conditions above, post visit ordering of testing, clinical documentation with the electronic health record, making appropriate referrals as documented, and communicating necessary findings to members of the patients care team.  Noemi Chapel, NP 06/02/2022  Pt aware and understands NP's role.

## 2022-10-28 IMAGING — DX DG CHEST 2V
2 series · 2 of 2 positions shown · non-contrast
Comparison: Chest radiograph November 05, 2012.

CLINICAL DATA: cough, wheeze

EXAM:
CHEST - 2 VIEW

[chest pa]
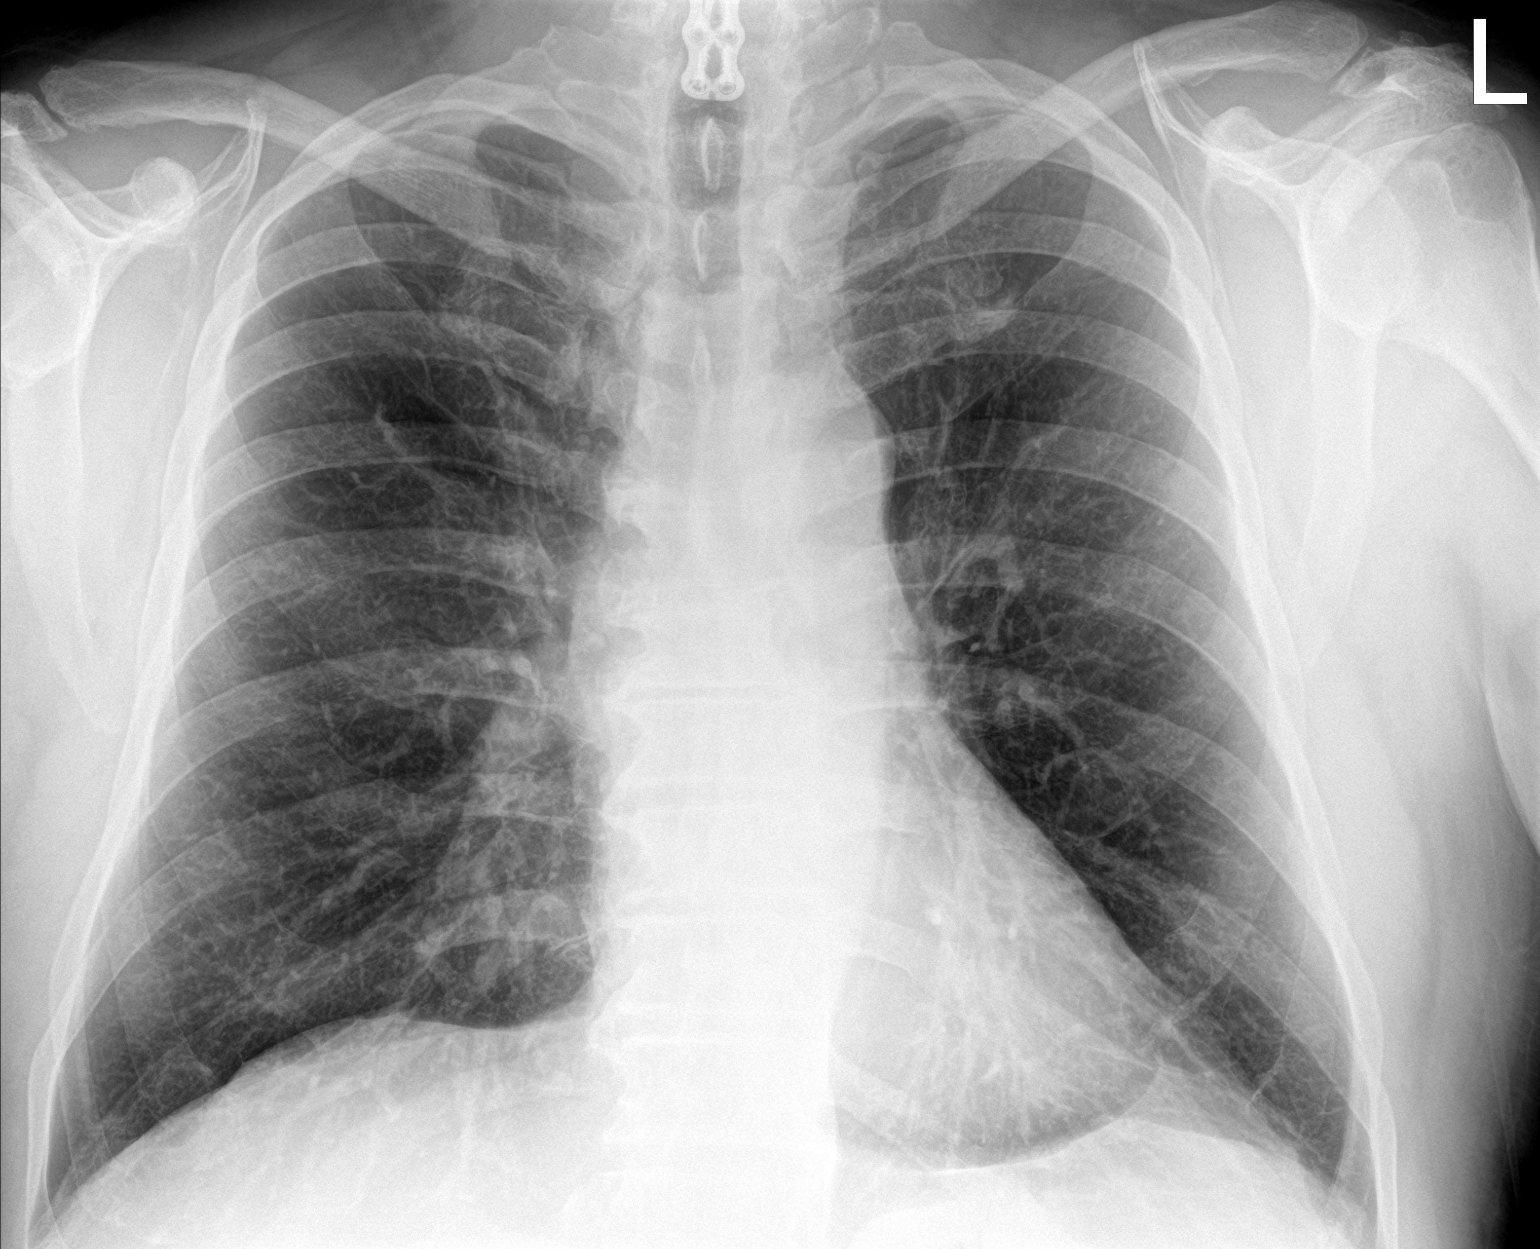

[chest lat]
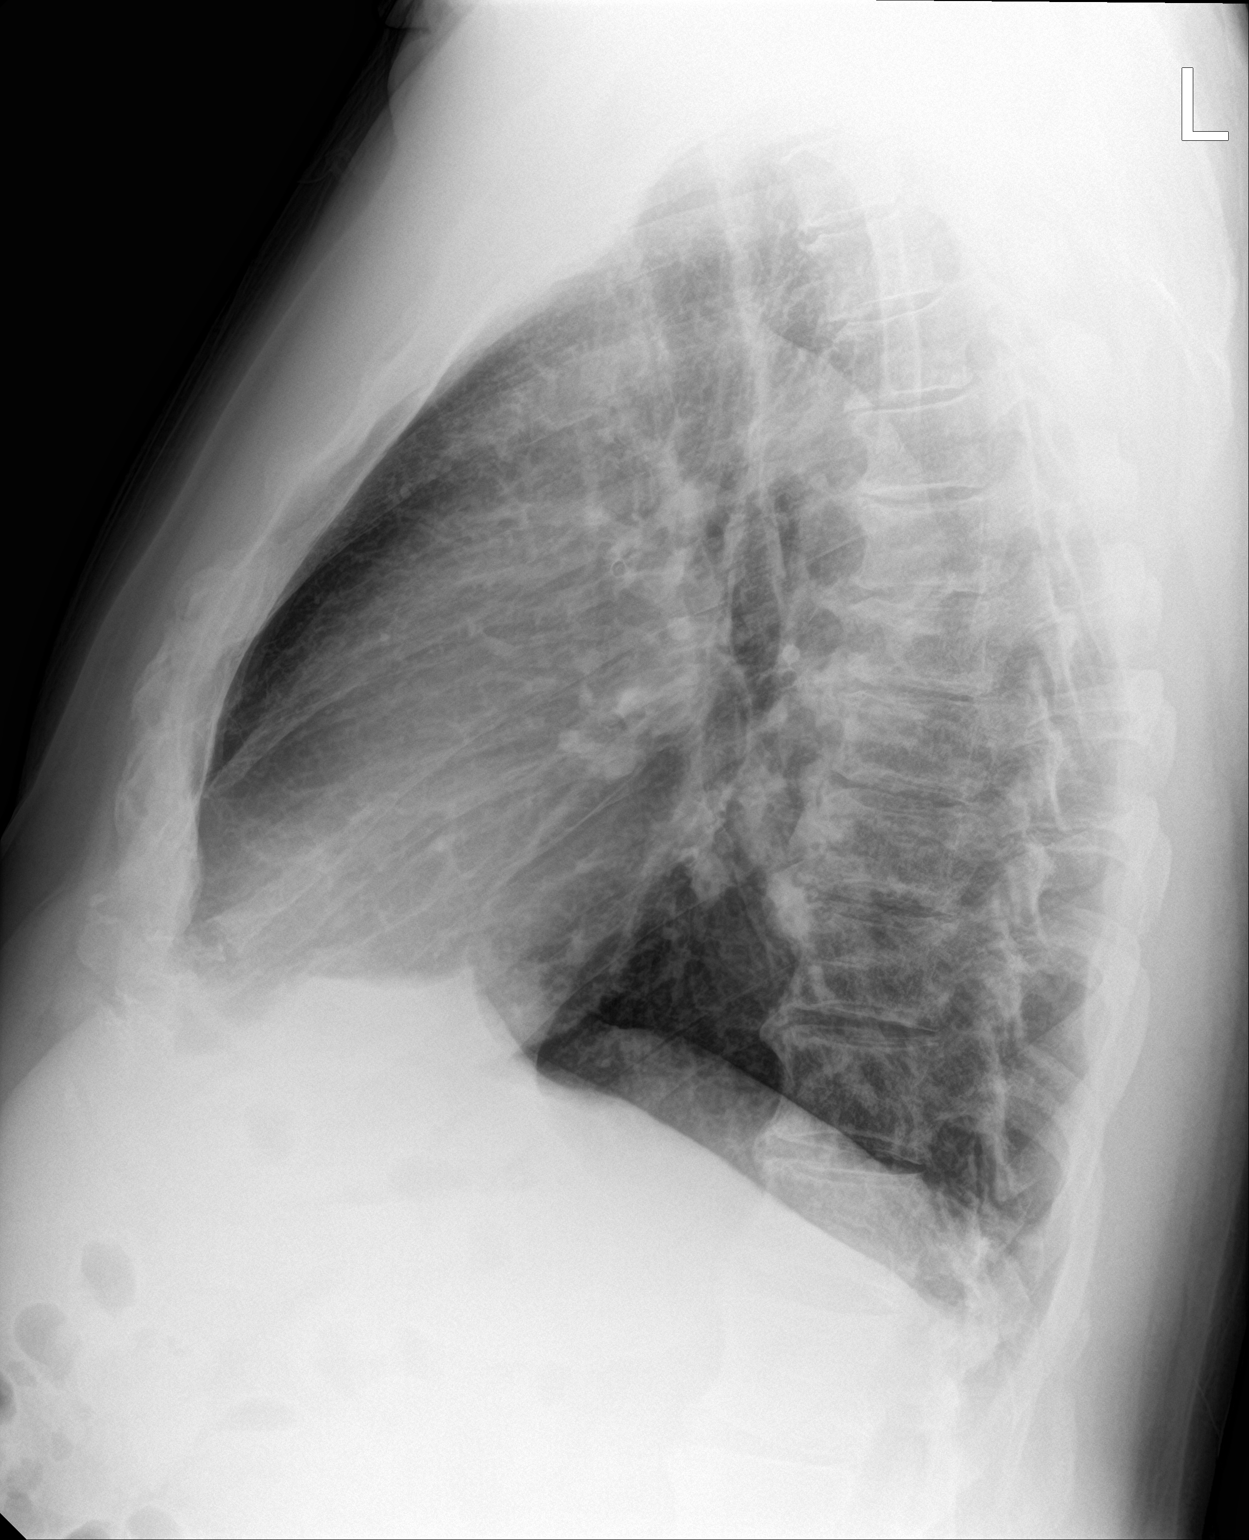

[2 of 2 positions shown; findings below may reference images not displayed]

FINDINGS: No consolidation. No visible pleural effusions or pneumothorax.
Cardiomediastinal silhouette is similar to prior and within normal
limits. No displaced fracture. Degenerative changes of the spine.
Partially imaged ACDF.
IMPRESSION: No evidence of acute cardiopulmonary disease.

## 2022-12-02 ENCOUNTER — Other Ambulatory Visit: Payer: Self-pay | Admitting: Internal Medicine

## 2023-05-04 ENCOUNTER — Encounter: Payer: Self-pay | Admitting: *Deleted

## 2023-05-04 NOTE — Progress Notes (Unsigned)
PATIENT: Michael Parsons DOB: Sep 29, 1955  REASON FOR VISIT: follow up HISTORY FROM: patient   Virtual Visit via Video Note  I connected with Michael Parsons on 05/05/23 at  1:00 PM EST by a video enabled telemedicine application located remotely at [facility] and verified that I am speaking with the correct person using two identifiers who was located at their own home.   I discussed the limitations of evaluation and management by telemedicine and the availability of in person appointments. The patient expressed understanding and agreed to proceed.   PATIENT: Michael Parsons DOB: [birth year]  REASON FOR VISIT: follow up HISTORY FROM: patient  HISTORY OF PRESENT ILLNESS: Today 05/05/23:  Michael Parsons is a 67 y.o. male with a history of OSA on CPAP. Returns today for follow-up.  Reports that CPAP is working well for him.  Denies any new issues.  Download is below       REVIEW OF SYSTEMS: Out of a complete 14 system review of symptoms, the patient complains only of the following symptoms, and all other reviewed systems are negative.  ALLERGIES: Allergies  Allergen Reactions   Sulfa Antibiotics Other (See Comments)    Unknown reaction    HOME MEDICATIONS: Outpatient Medications Prior to Visit  Medication Sig Dispense Refill   albuterol (VENTOLIN HFA) 108 (90 Base) MCG/ACT inhaler 2 puffs Inhalation 4x a day PRN for shortness of breath     BREO ELLIPTA 100-25 MCG/ACT AEPB 1 puff Inhalation Once a day     cetirizine (ZYRTEC) 10 MG tablet Take 10 mg by mouth daily as needed for allergies.      levothyroxine (SYNTHROID, LEVOTHROID) 88 MCG tablet Take 88 mcg by mouth at bedtime.      lisinopril-hydrochlorothiazide (PRINZIDE,ZESTORETIC) 20-12.5 MG tablet Take 1 tablet by mouth daily.     montelukast (SINGULAIR) 10 MG tablet TAKE 1 TABLET AT BEDTIME 90 tablet 3   omeprazole (PRILOSEC) 20 MG capsule Take 20 mg by mouth daily.     rosuvastatin  (CRESTOR) 10 MG tablet Take 10 mg by mouth daily.     No facility-administered medications prior to visit.    PAST MEDICAL HISTORY: Past Medical History:  Diagnosis Date   Arthritis    carpel tunnel   Asthma    does not have to use rescue inhaler often   Asthma    BPH (benign prostatic hyperplasia)    Deviated septum    GERD (gastroesophageal reflux disease)    Hyperlipidemia    Hypertension    Hypothyroidism    Hypothyroidism    Sleep apnea    cpap at night    PAST SURGICAL HISTORY: Past Surgical History:  Procedure Laterality Date   ANTERIOR CERVICAL DECOMP/DISCECTOMY FUSION N/A 11/11/2012   Procedure: ANTERIOR CERVICAL DECOMPRESSION/DISCECTOMY FUSION 2 LEVELS;  Surgeon: Tia Alert, MD;  Location: [facility];  Service: Neurosurgery;  Laterality: N/A;  Cervical five-six,Cervical six-seven   BACK SURGERY  04/2016   lumar didcectomy   HERNIA REPAIR  2007   umbilical    FAMILY HISTORY: Family History  Problem Relation Age of Onset   Sleep apnea Mother    Asthma Mother     SOCIAL HISTORY: Social History   Socioeconomic History   Marital status: Single    Spouse name: Not on file   Number of children: Not on file   Years of education: Not on file   Highest education level: Not on file  Occupational History  Not on file  Tobacco Use   Smoking status: Never   Smokeless tobacco: Never  Vaping Use   Vaping status: Never Used  Substance and Sexual Activity   Alcohol use: Not Currently   Drug use: No   Sexual activity: Not on file  Other Topics Concern   Not on file  Social History Narrative   Not on file   Social Determinants of Health   Financial Resource Strain: Not on file  Food Insecurity: Not on file  Transportation Needs: Not on file  Physical Activity: Not on file  Stress: Not on file  Social Connections: Not on file  Intimate Partner Violence: Not on file      PHYSICAL EXAM Generalized: Well developed, in no acute distress    Neurological examination  Mentation: Alert oriented to time, place, history taking. Follows all commands speech and language fluent Cranial nerve II-XII: Facial symmetry noted  DIAGNOSTIC DATA (LABS, IMAGING, TESTING) - I reviewed patient records, labs, notes, testing and imaging myself where available.  Lab Results  Component Value Date   WBC 6.8 11/11/2012   HGB 15.8 11/11/2012   HCT 45.1 11/11/2012   MCV 88.3 11/11/2012   PLT 193 11/11/2012      Component Value Date/Time   NA 140 11/11/2012 1138   K 3.8 11/11/2012 1138   CL 103 11/11/2012 1138   CO2 24 11/11/2012 1138   GLUCOSE 111 (H) 11/11/2012 1138   BUN 18 11/11/2012 1138   CREATININE 0.85 11/11/2012 1138   CALCIUM 9.3 11/11/2012 1138   GFRNONAA >90 11/11/2012 1138   GFRAA >90 11/11/2012 1138     ASSESSMENT AND PLAN 67 y.o. year old male  has a past medical history of Arthritis, Asthma, Asthma, BPH (benign prostatic hyperplasia), Deviated septum, GERD (gastroesophageal reflux disease), Hyperlipidemia, Hypertension, Hypothyroidism, Hypothyroidism, and Sleep apnea. here with:  OSA on CPAP  CPAP compliance excellent Residual AHI is good Encouraged patient to continue using CPAP nightly and > 4 hours each night F/U in 1 year or sooner if needed   Butch Penny, MSN, NP-C 05/05/2023, 12:13 PM [facility] [address] [place], [place] [address] [phone]

## 2023-05-05 ENCOUNTER — Telehealth: Payer: Medicare Other | Admitting: Adult Health

## 2023-05-05 DIAGNOSIS — G4733 Obstructive sleep apnea (adult) (pediatric): Secondary | ICD-10-CM | POA: Diagnosis not present

## 2023-06-20 ENCOUNTER — Other Ambulatory Visit: Payer: Self-pay

## 2023-06-20 ENCOUNTER — Emergency Department (HOSPITAL_COMMUNITY)
Admission: EM | Admit: 2023-06-20 | Discharge: 2023-06-20 | Disposition: A | Discharge status: Home / Self Care | Payer: Medicare Other | Attending: Emergency Medicine | Admitting: Emergency Medicine

## 2023-06-20 ENCOUNTER — Encounter (HOSPITAL_COMMUNITY): Payer: Self-pay

## 2023-06-20 ENCOUNTER — Emergency Department (HOSPITAL_COMMUNITY): Payer: Medicare Other

## 2023-06-20 DIAGNOSIS — R000 Tachycardia, unspecified: Secondary | ICD-10-CM | POA: Diagnosis not present

## 2023-06-20 DIAGNOSIS — S82451A Displaced comminuted fracture of shaft of right fibula, initial encounter for closed fracture: Secondary | ICD-10-CM | POA: Diagnosis not present

## 2023-06-20 DIAGNOSIS — S99911A Unspecified injury of right ankle, initial encounter: Secondary | ICD-10-CM | POA: Diagnosis present

## 2023-06-20 DIAGNOSIS — X501XXA Overexertion from prolonged static or awkward postures, initial encounter: Secondary | ICD-10-CM | POA: Diagnosis not present

## 2023-06-20 DIAGNOSIS — S93401A Sprain of unspecified ligament of right ankle, initial encounter: Secondary | ICD-10-CM | POA: Insufficient documentation

## 2023-06-20 MED ORDER — HYDROCODONE-ACETAMINOPHEN 5-325 MG PO TABS: 1.0000 | ORAL_TABLET | ORAL | 0 refills | Status: DC | PRN

## 2023-06-20 MED ORDER — IBUPROFEN 400 MG PO TABS
600.0000 mg | ORAL_TABLET | Freq: Once | ORAL | Status: AC
  Administered 2023-06-20: 600 mg via ORAL
  Filled 2023-06-20: qty 1

## 2023-06-20 NOTE — Discharge Instructions (Addendum)
For now, use the crutches and do not bear weight on your right foot.  You need to see the orthopedist, Dr. Aundria Rud, in about 1-2 weeks.  When at rest, elevate your leg above the level of your heart and you can apply ice to the splint without getting the splint wet.  We are prescribing you a stronger pain medicine called hydrocodone.  Do not drive or operate heavy machinery while on this.  Do not drink with alcohol or combine with other meds.  Before taking the hydrocodone, you should use ibuprofen and/or Tylenol and then if your pain is still severe then take the hydrocodone.  If you develop new or worsening or uncontrolled pain, numbness or color change to your toes, or any other new/concerning symptoms then return to the ER or call 911.

## 2023-06-20 NOTE — ED Provider Notes (Signed)
[facility] AT [facility] Provider Note   CSN: [id number] Arrival date & time: 06/20/23  1401     History  Chief Complaint  Patient presents with   Michael Parsons is a 67 y.o. male.  HPI 67 year old male presents with an ankle injury.  He slipped while carrying objects on the wet ground.  His right foot went under his left leg and he thought that at least temporarily something was out of place.  The pain is primarily to his right anterior ankle.  He also has some swelling.  Has not really tried to walk on it.  The pain is moderate.  No new weakness or numbness.  No knee pain.  He did not hit his head or suffer any other injuries.  Home Medications Prior to Admission medications   Medication Sig Start Date End Date Taking? Authorizing Provider  HYDROcodone-acetaminophen (NORCO) 5-325 MG tablet Take 1 tablet by mouth every 4 (four) hours as needed for severe pain (pain score 7-10). 06/20/23  Yes Pricilla Loveless, MD  albuterol (VENTOLIN HFA) 108 (90 Base) MCG/ACT inhaler 2 puffs Inhalation 4x a day PRN for shortness of breath 12/09/21   [provider]  BREO ELLIPTA 100-25 MCG/ACT AEPB 1 puff Inhalation Once a day 12/06/21   [provider]  cetirizine (ZYRTEC) 10 MG tablet Take 10 mg by mouth daily as needed for allergies.     [provider]  levothyroxine (SYNTHROID, LEVOTHROID) 88 MCG tablet Take 88 mcg by mouth at bedtime.     [provider]  lisinopril-hydrochlorothiazide (PRINZIDE,ZESTORETIC) 20-12.5 MG tablet Take 1 tablet by mouth daily.    [provider]  montelukast (SINGULAIR) 10 MG tablet TAKE 1 TABLET AT BEDTIME 12/04/22   Charlott Holler, MD  omeprazole (PRILOSEC) 20 MG capsule Take 20 mg by mouth daily.    [provider]  rosuvastatin (CRESTOR) 10 MG tablet Take 10 mg by mouth daily.    [provider]      Allergies    Sulfa antibiotics    Review of Systems    Review of Systems  Musculoskeletal:  Positive for arthralgias and joint swelling.  Neurological:  Negative for weakness and numbness.    Physical Exam Updated Vital Signs BP (!) 141/53   Pulse 96   Temp 98.3 F (36.8 C)   Resp 18   Ht 6' (1.829 m)   Wt 127.5 kg   SpO2 99%   BMI 38.11 kg/m  Physical Exam Vitals and nursing note reviewed.  Constitutional:      General: He is not in acute distress.    Appearance: He is well-developed. He is not ill-appearing or diaphoretic.  HENT:     Head: Normocephalic and atraumatic.  Cardiovascular:     Rate and Rhythm: Regular rhythm. Tachycardia present.     Pulses:          Dorsalis pedis pulses are 2+ on the right side.  Pulmonary:     Effort: Pulmonary effort is normal.  Musculoskeletal:     Right knee: Normal range of motion. No tenderness.     Right lower leg: No deformity or tenderness.     Right ankle: Swelling present. No deformity. Tenderness (anterior) present. Normal range of motion.     Comments: Normal movement of toes  Skin:    General: Skin is warm and dry.  Neurological:     Mental Status: He is alert.  ED Results / Procedures / Treatments   Labs (all labs ordered are listed, but only abnormal results are displayed) Labs Reviewed - No data to display  EKG None  Radiology DG Tibia/Fibula Right Result Date: 06/20/2023 CLINICAL DATA:  Fall. EXAM: RIGHT TIBIA AND FIBULA - 2 VIEW COMPARISON:  None Available. FINDINGS: Bone mineralization within normal limits for patient's age. There is mildly displaced comminuted fracture of the middle third shaft of the right fibula. No other acute fracture or dislocation. No aggressive osseous lesion. Mild degenerative changes of the knee joint. Ankle mortise appears intact. Calcaneal spur noted along the Achilles tendon and Plantar aponeurosis attachment sites. No focal soft tissue swelling. No radiopaque foreign bodies. IMPRESSION: *Mildly displaced comminuted fracture of  the middle third shaft of the right fibula. Electronically Signed   By: Jules Schick M.D.   On: 06/20/2023 18:43   DG Ankle Complete Right Result Date: 06/20/2023 CLINICAL DATA:  Slipped and fell, right ankle injury EXAM: RIGHT ANKLE - COMPLETE 3+ VIEW; RIGHT FOOT COMPLETE - 3+ VIEW COMPARISON:  None Available. FINDINGS: Right ankle: Frontal, oblique, and lateral views are obtained. On the periphery of the oblique projection, and oblique mid fibular diaphyseal fracture is partially visualized. Dedicated views of the right tibia and fibula are recommended. No other acute right ankle fractures. Alignment is anatomic. Joint spaces are well preserved. Mild anterior soft tissue swelling. Right foot: Frontal, oblique, and lateral views are obtained. No acute displaced fracture, subluxation, or dislocation. Joint spaces are relatively well preserved. Small superior and inferior calcaneal spurs. Soft tissues are unremarkable. IMPRESSION: 1. Oblique mid right fibular diaphyseal fracture is partially visualized on the ankle series. Dedicated right tibia and fibula x-rays are recommended. 2. Otherwise no acute right ankle or right foot fractures. 3. Small superior and inferior calcaneal spurs. 4. Anterior soft tissue swelling at the ankle. Electronically Signed   By: Sharlet Salina M.D.   On: 06/20/2023 16:23   DG Foot Complete Right Result Date: 06/20/2023 CLINICAL DATA:  Slipped and fell, right ankle injury EXAM: RIGHT ANKLE - COMPLETE 3+ VIEW; RIGHT FOOT COMPLETE - 3+ VIEW COMPARISON:  None Available. FINDINGS: Right ankle: Frontal, oblique, and lateral views are obtained. On the periphery of the oblique projection, and oblique mid fibular diaphyseal fracture is partially visualized. Dedicated views of the right tibia and fibula are recommended. No other acute right ankle fractures. Alignment is anatomic. Joint spaces are well preserved. Mild anterior soft tissue swelling. Right foot: Frontal, oblique, and lateral  views are obtained. No acute displaced fracture, subluxation, or dislocation. Joint spaces are relatively well preserved. Small superior and inferior calcaneal spurs. Soft tissues are unremarkable. IMPRESSION: 1. Oblique mid right fibular diaphyseal fracture is partially visualized on the ankle series. Dedicated right tibia and fibula x-rays are recommended. 2. Otherwise no acute right ankle or right foot fractures. 3. Small superior and inferior calcaneal spurs. 4. Anterior soft tissue swelling at the ankle. Electronically Signed   By: Sharlet Salina M.D.   On: 06/20/2023 16:23    Procedures Procedures    Medications Ordered in ED Medications  ibuprofen (ADVIL) tablet 600 mg (600 mg Oral Given 06/20/23 1814)    ED Course/ Medical Decision Making/ A&P                                 Medical Decision Making Amount and/or Complexity of Data Reviewed Radiology: ordered and independent interpretation performed.  Details: Mid fibula fracture  Risk Prescription drug management.   Patient presents with what appears to be mostly an ankle sprain but also a fibula fracture in the midshaft.  No knee symptoms.  Did discuss with Dr. Aundria Rud who recommends a full splint and nonweightbearing besides some touchdown weightbearing.  Also recommends that his ankle be put in 90 degrees.  Patient tolerated this well, is having a little more discomfort with the positioning and would like something stronger for home such as hydrocodone.  Will otherwise recommend NSAIDs and Tylenol in addition to a short course of hydrocodone and he was given crutches and will follow-up with orthopedics as an outpatient.  Appears neurovasc intact.        Final Clinical Impression(s) / ED Diagnoses Final diagnoses:  Closed displaced comminuted fracture of shaft of right fibula, initial encounter  Sprain of right ankle, unspecified ligament, initial encounter    Rx / DC Orders ED Discharge Orders          Ordered     HYDROcodone-acetaminophen (NORCO) 5-325 MG tablet  Every 4 hours PRN        06/20/23 1917              Pricilla Loveless, MD 06/20/23 2312

## 2023-06-20 NOTE — ED Triage Notes (Signed)
Patient slipped on steps and rolled right ankle and it was rolled underneath him and reports it was not in the anatomical position.  Siginificant swelling to right ankle +pedal pulse

## 2023-06-20 NOTE — ED Provider Triage Note (Signed)
Emergency Medicine Provider Triage Evaluation Note  BERKE SCHICK , a 67 y.o. male  was evaluated in triage.  Pt complains of ankle pain.  Review of Systems  Positive:  Negative:   Physical Exam  There were no vitals taken for this visit. Gen:   Awake, no distress   Resp:  Normal effort  MSK:   Moves extremities without difficulty  Other:    Medical Decision Making  Medically screening exam initiated at 2:49 PM.  Appropriate orders placed.  Lowry Bowl was informed that the remainder of the evaluation will be completed by another provider, this initial triage assessment does not replace that evaluation, and the importance of remaining in the ED until their evaluation is complete.  Mechanical fall. Injured right ankle. Denies head trauma/LOC/seizures/blood thinners - does not want head imaging today. Denies any other symptoms.   Dorthy Cooler, [place] 06/20/23 1451

## 2023-06-20 NOTE — Progress Notes (Signed)
Orthopedic Tech Progress Note Patient Details:  DADDY DISOTELL August 07, 1955 [id number]  Ortho Devices Type of Ortho Device: Stirrup splint, Short leg splint, Crutches Ortho Device/Splint Location: RLE Ortho Device/Splint Interventions: Application   Post Interventions Patient Tolerated: Well, Ambulated well  Eliberto Ivory E Benicia Bergevin 06/20/2023, 7:04 PM

## 2023-07-10 ENCOUNTER — Ambulatory Visit: Payer: Medicare Other | Admitting: Internal Medicine

## 2023-07-10 ENCOUNTER — Encounter: Payer: Self-pay | Admitting: Internal Medicine

## 2023-07-10 VITALS — BP 138/62 | HR 114 | Ht 72.0 in | Wt 265.2 lb

## 2023-07-10 DIAGNOSIS — J301 Allergic rhinitis due to pollen: Secondary | ICD-10-CM

## 2023-07-10 DIAGNOSIS — G4733 Obstructive sleep apnea (adult) (pediatric): Secondary | ICD-10-CM | POA: Diagnosis not present

## 2023-07-10 DIAGNOSIS — K219 Gastro-esophageal reflux disease without esophagitis: Secondary | ICD-10-CM

## 2023-07-10 DIAGNOSIS — J4520 Mild intermittent asthma, uncomplicated: Secondary | ICD-10-CM

## 2023-07-10 NOTE — Progress Notes (Signed)
Michael Parsons    [id number]    [birth year]  Primary Care Physician:Tisovec, Adelfa Koh, MD Date of Appointment: 07/10/2023 Established Patient Visit  Chief complaint:   Chief Complaint  Patient presents with   Follow-up     HPI: Michael Parsons is a 68 y.o. man with asthma, OSA on CPAP, allerigc rhinitis and GERD  Interval Updates: Here for asthma follow up. No prednisone in the last year.  Had episode of URI in the fall, used Breo at that time. No worsening requiring prednisone. Used it during visiting family during thanksgiving because they had a cat. No issues.   Current Regimen: albuterol as needed. Takes prn breo for respiratory illness Asthma Triggers: cats. Exercise, cold air. URIs Exacerbations in the last year: 0 History of hospitalization or intubation: Allergy Testing: yes as a child, had SCIT for about 3 years.  GERD: yes, he takes omeprazole.  Allergic Rhinitis: singulair, cetirizine  Had fall in Dec 2024 resulting in right fibula fracture. In a cast for the next few weeks and using crutches.   I have reviewed the patient's family social and past medical history and updated as appropriate.   Past Medical History:  Diagnosis Date   Arthritis    carpel tunnel   Asthma    does not have to use rescue inhaler often   Asthma    BPH (benign prostatic hyperplasia)    Deviated septum    DVT (deep venous thrombosis) (HCC)    GERD (gastroesophageal reflux disease)    Hyperlipidemia    Hypertension    Hypothyroidism    Hypothyroidism    Sleep apnea    cpap at night    Past Surgical History:  Procedure Laterality Date   ANTERIOR CERVICAL DECOMP/DISCECTOMY FUSION N/A 11/11/2012   Procedure: ANTERIOR CERVICAL DECOMPRESSION/DISCECTOMY FUSION 2 LEVELS;  Surgeon: Tia Alert, MD;  Location: [facility];  Service: Neurosurgery;  Laterality: N/A;  Cervical five-six,Cervical six-seven   BACK SURGERY  04/2016   lumar didcectomy   HERNIA REPAIR  2007    umbilical    Family History  Problem Relation Age of Onset   Sleep apnea Mother    Asthma Mother     Social History   Occupational History   Not on file  Tobacco Use   Smoking status: Never   Smokeless tobacco: Never  Vaping Use   Vaping status: Never Used  Substance and Sexual Activity   Alcohol use: Not Currently   Drug use: No   Sexual activity: Not on file     Physical Exam: Blood pressure 138/62, pulse (!) 114, height 6' (1.829 m), weight 265 lb 3.2 oz (120.3 kg), SpO2 98%.  Gen:      No acute distress ENT:  no nasal polyps, mucus membranes moist Lungs:    No increased respiratory effort, symmetric chest wall excursion, clear to auscultation bilaterally, no wheezes or crackles CV:         Regular rate and rhythm; no murmurs, rubs, or gallops.  No pedal edema Ext: right foot wrapped in cast  Data Reviewed: Imaging: I have personally reviewed the chest xray May 2023 - no acute process  PFTs:    Labs: Lab Results  Component Value Date   WBC 6.8 11/11/2012   HGB 15.8 11/11/2012   HCT 45.1 11/11/2012   MCV 88.3 11/11/2012   PLT 193 11/11/2012   Lab Results  Component Value Date   NA 140 11/11/2012  K 3.8 11/11/2012   CL 103 11/11/2012   CO2 24 11/11/2012     Immunization status:  There is no immunization history on file for this patient.  External Records Personally Reviewed: pulmonary  Assessment:  Mild intermittent asthma Chronic seasonal allergic rhinitis GERD OSA on CPAP  Plan/Recommendations:  Glad to hear the breathing is doing well! Keep your allergies and reflux under good control to optimize your asthma control- use meds as needed over the counter.  Continue wearing CPAP.   He will contact me to let me know what inhaler he wants to use after talking to his insurance company.   Return to Care: Return in about 1 year (around 07/09/2024).   Durel Salts, MD Pulmonary and Critical Care Medicine [facility] Office:[phone]

## 2023-07-10 NOTE — Patient Instructions (Addendum)
It was a pleasure to see you today!  Please schedule follow up scheduled with myself in 1 year.  If my schedule is not open yet, we will contact you with a reminder closer to that time. Please call [phone] if you haven't heard from [place] a month before, and always call us sooner if issues or concerns arise. You can also send [place] a message through MyChart, but but aware that this is not to be used for urgent issues and it may take up to 5-7 days to receive a reply. Please be aware that you will likely be able to view your results before I have a chance to respond to them. Please give [place] 5 business days to respond to any non-urgent results.    Glad to hear the breathing is doing well! Keep your allergies and reflux under good control to optimize your asthma control- use meds as needed over the counter.  Continue wearing CPAP.   Continue the steroid inhaler use such as Breo. Because you do not have daily symptoms, using this inhaler on an as needed basis may be sufficient.  I recommend starting to use the inhaler daily as prescribed if you have symptoms of asthma such as chest tightness, wheezing, coughing, shortness of breath. You should also start using it if you have exposure to a sick contact, worsening allergies, or any other trigger for your asthma. I recommend you keep using it even after your respiratory symptoms resolve for 3-4 days. The goal of this therapy is to prevent your symptoms from becoming a flare severe enough to require steroids like prednisone.   Please call our office if using this inhaler on an as needed basis is not sufficient. You might need to be seen sooner than our scheduled follow up.    Please talk with your insurance company and let me know what inhaler you want to use.  Some options: Budesonide-formoterol Fluticasone-salmeterol Mometasone-formoterol Fluticasone-vilanterol

## 2023-07-22 ENCOUNTER — Encounter: Payer: Self-pay | Admitting: Internal Medicine

## 2023-07-28 MED ORDER — BUDESONIDE-FORMOTEROL FUMARATE 160-4.5 MCG/ACT IN AERO: 2.0000 | INHALATION_SPRAY | Freq: Two times a day (BID) | RESPIRATORY_TRACT | 5 refills | Status: DC

## 2023-11-20 ENCOUNTER — Other Ambulatory Visit: Payer: Self-pay | Admitting: Internal Medicine

## 2024-03-21 DIAGNOSIS — I4891 Unspecified atrial fibrillation: Secondary | ICD-10-CM | POA: Insufficient documentation

## 2024-03-22 DIAGNOSIS — I4892 Unspecified atrial flutter: Secondary | ICD-10-CM | POA: Insufficient documentation

## 2024-04-13 ENCOUNTER — Ambulatory Visit: Attending: Cardiovascular Disease | Admitting: Cardiovascular Disease

## 2024-04-13 VITALS — BP 110/64 | HR 66 | Ht 72.0 in | Wt 250.1 lb

## 2024-04-13 DIAGNOSIS — I4891 Unspecified atrial fibrillation: Secondary | ICD-10-CM | POA: Insufficient documentation

## 2024-04-13 NOTE — Progress Notes (Unsigned)
  Electrophysiology Office Note:    Date:  04/14/2024   ID:  Michael Parsons, DOB 11-Jul-1955, MRN [id number]  PCP:  Vernadine Charlie ORN, MD   [facility] Providers Cardiologist:  None     Referring MD: Vernadine Charlie ORN, MD   History of Present Illness:    Michael Parsons is a 68 y.o. male with a medical history significant for atrial fibrillation, carotid artery disease, hypertension, sleep apnea, referred for arrhythmia management.       Discussed the use of AI scribe software for clinical note transcription with the patient, who gave verbal consent to proceed.  History of Present Illness  He experiences episodes of rapid heartbeats, initially triggered by extreme exertion, with associated elevated blood pressure and mild dyspnea. These episodes have occurred three times since last year. The most recent episode occurred at rest in [place] , with a heart rate of 130 bpm, leading to hospitalization and cardioversion.  He is on metoprolol, which have helped manage his heart rate. A recent episode of tachycardia occurred on Sunday evening, with a heart rate of 130 bpm, resolving within 15 minutes. His resting heart rate is typically in the low sixties, but has dropped to 48-50 bpm since starting metoprolol.  He was recently traveling in [place]  when he was noted to have a fast heart rate and went to the local ER and was diagnosed with atrial flutter.  He was placed on metoprolol and underwent TEE cardioversion and discharged on Eliquis        Today, he reports that he feels well and has not acute complaints.  EKGs/Labs/Other Studies Reviewed Today:      EKG:   EKG Interpretation Date/Time:  Wednesday April 13 2024 15:49:35 EDT Ventricular Rate:  66 PR Interval:  180 QRS Duration:  110 QT Interval:  418 QTC Calculation: 438 R Axis:   6  Text Interpretation: Normal sinus rhythm Normal ECG When compared with ECG of 05-Nov-2012 14:59, No significant change was found  Confirmed by Nancey Scotts [id number]) on 04/14/2024 6:12:25 PM     Physical Exam:    VS:  BP 110/64   Pulse 66   Ht 6' (1.829 m)   Wt 250 lb 1.6 oz (113.4 kg)   SpO2 96%   BMI 33.92 kg/m     Wt Readings from Last 3 Encounters:  04/13/24 250 lb 1.6 oz (113.4 kg)  07/10/23 265 lb 3.2 oz (120.3 kg)  06/20/23 281 lb (127.5 kg)     GEN: Well nourished, well developed in no acute distress CARDIAC: RRR, no murmurs, rubs, gallops RESPIRATORY:  Normal work of breathing MUSCULOSKELETAL: no edema    ASSESSMENT & PLAN:     Atrial flutter Documented in records from hospitalization in [place]  Status post TEE cardioversion We will request records --specifically, EKG showing flutter and TEE results Pending receipt of EKG documenting flutter, we will plan to proceed with a flutter ablation I discussed the indication and logistics of the flutter ablation with the patient and his partner.  I explained the risks including bleeding, organ damage, need for pacemaker.  Secondary hypercoagulable state CHA2DS2-VASc score is 3 Continue apixaban 5 mg p.o. twice daily  Hypertension BP controlled today  Obstructive sleep apnea Uses CPAP      Signed, Scotts FORBES Nancey, MD  04/14/2024 6:15 PM    [facility]

## 2024-04-13 NOTE — Patient Instructions (Signed)
 Medication Instructions:  Your physician recommends that you continue on your current medications as directed. Please refer to the Current Medication list given to you today.  *If you need a refill on your cardiac medications before your next appointment, please call your pharmacy*  Lab Work: None ordered.  If you have labs (blood work) drawn today and your tests are completely normal, you will receive your results only by: MyChart Message (if you have MyChart) OR A paper copy in the mail If you have any lab test that is abnormal or we need to change your treatment, we will call you to review the results.  Testing/Procedures: None ordered.   Follow-Up: At [facility], you and your health needs are our priority.  As part of our continuing mission to provide you with exceptional heart care, our providers are all part of one team.  This team includes your primary Cardiologist (physician) and Advanced Practice Providers or APPs (Physician Assistants and Nurse Practitioners) who all work together to provide you with the care you need, when you need it.  Your next appointment:   3 months with Dr Mealor

## 2024-04-20 ENCOUNTER — Other Ambulatory Visit: Payer: Self-pay

## 2024-04-20 ENCOUNTER — Telehealth: Payer: Self-pay | Admitting: Cardiovascular Disease

## 2024-04-20 ENCOUNTER — Emergency Department (HOSPITAL_COMMUNITY)

## 2024-04-20 ENCOUNTER — Encounter (HOSPITAL_COMMUNITY): Payer: Self-pay

## 2024-04-20 ENCOUNTER — Emergency Department (HOSPITAL_COMMUNITY)
Admission: EM | Admit: 2024-04-20 | Discharge: 2024-04-20 | Disposition: A | Discharge status: Home / Self Care | Attending: Emergency Medicine | Admitting: Emergency Medicine

## 2024-04-20 ENCOUNTER — Encounter: Payer: Self-pay | Admitting: Cardiovascular Disease

## 2024-04-20 DIAGNOSIS — Z7901 Long term (current) use of anticoagulants: Secondary | ICD-10-CM | POA: Diagnosis not present

## 2024-04-20 DIAGNOSIS — R000 Tachycardia, unspecified: Secondary | ICD-10-CM | POA: Diagnosis present

## 2024-04-20 DIAGNOSIS — D72829 Elevated white blood cell count, unspecified: Secondary | ICD-10-CM | POA: Diagnosis not present

## 2024-04-20 DIAGNOSIS — Z79899 Other long term (current) drug therapy: Secondary | ICD-10-CM | POA: Insufficient documentation

## 2024-04-20 DIAGNOSIS — I4891 Unspecified atrial fibrillation: Secondary | ICD-10-CM | POA: Diagnosis not present

## 2024-04-20 DIAGNOSIS — I10 Essential (primary) hypertension: Secondary | ICD-10-CM | POA: Insufficient documentation

## 2024-04-20 LAB — CBC WITH DIFFERENTIAL/PLATELET
Abs Immature Granulocytes: 0.02 K/uL (ref 0.00–0.07)
Basophils Absolute: 0.1 K/uL (ref 0.0–0.1)
Basophils Relative: 1 %
Eosinophils Absolute: 0 K/uL (ref 0.0–0.5)
Eosinophils Relative: 0 %
HCT: 47.1 % (ref 39.0–52.0)
Hemoglobin: 16.1 g/dL (ref 13.0–17.0)
Immature Granulocytes: 0 %
Lymphocytes Relative: 11 %
Lymphs Abs: 1.2 K/uL (ref 0.7–4.0)
MCH: 30.9 pg (ref 26.0–34.0)
MCHC: 34.2 g/dL (ref 30.0–36.0)
MCV: 90.4 fL (ref 80.0–100.0)
Monocytes Absolute: 0.7 K/uL (ref 0.1–1.0)
Monocytes Relative: 6 %
Neutro Abs: 8.7 K/uL — ABNORMAL HIGH (ref 1.7–7.7)
Neutrophils Relative %: 82 %
Platelets: 168 K/uL (ref 150–400)
RBC: 5.21 MIL/uL (ref 4.22–5.81)
RDW: 13.8 % (ref 11.5–15.5)
WBC: 10.6 K/uL — ABNORMAL HIGH (ref 4.0–10.5)
nRBC: 0 % (ref 0.0–0.2)

## 2024-04-20 LAB — BASIC METABOLIC PANEL WITH GFR
Anion gap: 14 (ref 5–15)
BUN: 13 mg/dL (ref 8–23)
CO2: 24 mmol/L (ref 22–32)
Calcium: 8.8 mg/dL — ABNORMAL LOW (ref 8.9–10.3)
Chloride: 99 mmol/L (ref 98–111)
Creatinine, Ser: 0.79 mg/dL (ref 0.61–1.24)
GFR, Estimated: >60 mL/min (ref >60)
Glucose, Bld: 112 mg/dL — ABNORMAL HIGH (ref 70–99)
Potassium: 3.9 mmol/L (ref 3.5–5.1)
Sodium: 137 mmol/L (ref 135–145)

## 2024-04-20 LAB — TROPONIN I (HIGH SENSITIVITY): Troponin I (High Sensitivity): 9 ng/L (ref <18)

## 2024-04-20 LAB — MAGNESIUM: Magnesium: 1.9 mg/dL (ref 1.7–2.4)

## 2024-04-20 MED ORDER — METOPROLOL TARTRATE 5 MG/5ML IV SOLN
5.0000 mg | Freq: Once | INTRAVENOUS | Status: AC
  Administered 2024-04-20: 5 mg via INTRAVENOUS
  Filled 2024-04-20: qty 5

## 2024-04-20 MED ORDER — LISINOPRIL-HYDROCHLOROTHIAZIDE 20-12.5 MG PO TABS: 0.5000 | ORAL_TABLET | Freq: Every day | ORAL | 0 refills | Status: DC

## 2024-04-20 NOTE — ED Notes (Signed)
 Patient transported to X-ray

## 2024-04-20 NOTE — Telephone Encounter (Signed)
 Spoke with pt, he was wakened this morning with his heart palpitating. His first ECG was normal, but since then it has read tachycardia. He sent those ECG's through my chart. He just took his metoprolol, bp 116/76 and heart rate is 130. Aware will discuss with MD here in the office and call, him back.

## 2024-04-20 NOTE — Telephone Encounter (Signed)
 Spoke with pt, after discussing with dr hochrein(dod), patient advised to go to the ER.

## 2024-04-20 NOTE — Discharge Instructions (Addendum)
 Please STOP taking your aspirin while you are on Eliquis.  Consult with your cardiologist for further instructions.  You will cut your Lisinopril-hydrochlorothiazide  medication dose in half, to help prevent low drops in blood pressure.   Try to avoid caffeine and alcohol which can be irritants for your heart.

## 2024-04-20 NOTE — ED Provider Notes (Signed)
 [place] EMERGENCY DEPARTMENT AT [facility] Provider Note   CSN: [id number] Arrival date & time: 04/20/24  1035     Patient presents with: Tachycardia   Michael Parsons is a 68 y.o. male patient with history of paroxysmal atrial fibrillation on Eliquis who presents to the emergency department today for further evaluation of palpitations and fast heart rate.  Patient was woken up from sleep around 1 AM secondary to palpitations.  Felt like it was going fast and pounding.  Heart rate was normal at that time via cardiomobile equipment that he had at home.  Patient was still experiencing some palpitations and awoke a couple hours later after tried to go back to sleep and his heart rate was now in the 130s.  He states that this is similar to when he had paroxysmal atrial fibrillation in the past when he was on vacation in [place] .  That required a TEE cardioversion at that time.  Patient at that time was placed on Eliquis and has not missed any doses.  He denies any chest pain or shortness of breath but does still complain of the palpitations.   HPI     Prior to Admission medications   Medication Sig Start Date End Date Taking? Authorizing Provider  albuterol  (VENTOLIN  HFA) 108 (90 Base) MCG/ACT inhaler 2 puffs Inhalation 4x a day PRN for shortness of breath 12/09/21   [provider]  budesonide -formoterol  (SYMBICORT ) 160-4.5 MCG/ACT inhaler Inhale 2 puffs into the lungs in the morning and at bedtime. 07/28/23   Desai, Nikita S, MD  cetirizine (ZYRTEC) 10 MG tablet Take 10 mg by mouth daily as needed for allergies.     [provider]  ELIQUIS 5 MG TABS tablet Take 5 mg by mouth 2 (two) times daily.    [provider]  levothyroxine  (SYNTHROID , LEVOTHROID) 88 MCG tablet Take 88 mcg by mouth at bedtime.  Patient taking differently: Take 88 mcg by mouth daily before breakfast.    [provider]  lisinopril-hydrochlorothiazide  (ZESTORETIC) 20-12.5 MG  tablet Take 0.5 tablets by mouth daily. 04/20/24 05/20/24  Cottie Donnice PARAS, MD  metoprolol succinate (TOPROL-XL) 25 MG 24 hr tablet Take 25 mg by mouth daily.    [provider]  montelukast  (SINGULAIR ) 10 MG tablet TAKE 1 TABLET AT BEDTIME 11/20/23   Desai, Nikita S, MD  omeprazole (PRILOSEC) 20 MG capsule Take 20 mg by mouth daily.    [provider]  rosuvastatin (CRESTOR) 10 MG tablet Take 10 mg by mouth daily.    [provider]  tamsulosin (FLOMAX) 0.4 MG CAPS capsule Take 0.4 mg by mouth daily. 10/21/23   [provider]    Allergies: Sulfa antibiotics    Review of Systems  All other systems reviewed and are negative.   Updated Vital Signs BP (!) 100/58   Pulse 83   Temp 98.7 F (37.1 C) (Oral)   Resp 18   Ht 6' (1.829 m)   Wt 112.5 kg   SpO2 97%   BMI 33.63 kg/m   Physical Exam  (all labs ordered are listed, but only abnormal results are displayed) Labs Reviewed  CBC WITH DIFFERENTIAL/PLATELET - Abnormal; Notable for the following components:      Result Value   WBC 10.6 (*)    Neutro Abs 8.7 (*)    All other components within normal limits  BASIC METABOLIC PANEL WITH GFR - Abnormal; Notable for the following components:   Glucose, Bld 112 (*)  Calcium 8.8 (*)    All other components within normal limits  MAGNESIUM  TROPONIN I (HIGH SENSITIVITY)    EKG: EKG Interpretation Date/Time:  Wednesday April 20 2024 10:42:54 EDT Ventricular Rate:  136 PR Interval:    QRS Duration:  162 QT Interval:  362 QTC Calculation: 544 R Axis:   -19  Text Interpretation: Atrial flutter with 2:1 A-V conduction Non-specific intra-ventricular conduction block Minimal voltage criteria for LVH, may be normal variant ( Cornell product ) Compared with prior EKG from 04/13/2024 Confirmed by Gennaro Bouchard ([id number]) on 04/20/2024 10:59:24 AM  Radiology: DG Chest 2 View Result Date: 04/20/2024 EXAM: 2 VIEW(S) XRAY OF THE CHEST 04/20/2024  12:01:00 PM COMPARISON: 11/18/2021 CLINICAL HISTORY: palpitations. Table formatting from the original note was not included.; Reason for exam: palpitations; Per triage: ; Pt states he was diagnosed with atrial flutter and had a cardioversion a month ago. Pt has been using cardiomobile equipment which read tachycardia and sent to cardiologist. Today pt woke up because he felt palpitations, sent it to MD and was told to ; come to ED for eval. Pt denies pain or shortness of breath. FINDINGS: LUNGS AND PLEURA: No focal pulmonary opacity. No pulmonary edema. No pleural effusion. No pneumothorax. HEART AND MEDIASTINUM: No acute abnormality of the cardiac and mediastinal silhouettes. BONES AND SOFT TISSUES: No acute osseous abnormality. IMPRESSION: 1. No acute cardiopulmonary disease. Electronically signed by: [name] Seip MD 04/20/2024 01:49 PM EDT RP Workstation: [id number]     .Critical Care  Performed by: Theotis Cameron HERO, PA-C Authorized by: Theotis Cameron HERO, PA-C   Critical care provider statement:    Critical care time (minutes):  35   Critical care time was exclusive of:  Separately billable procedures and treating other patients   Critical care was necessary to treat or prevent imminent or life-threatening deterioration of the following conditions:  Circulatory failure   Critical care was time spent personally by me on the following activities:  Blood draw for specimens, development of treatment plan with patient or surrogate, discussions with primary provider, ordering and performing treatments and interventions, ordering and review of laboratory studies, ordering and review of radiographic studies, pulse oximetry and re-evaluation of patient's condition    Medications Ordered in the ED  metoprolol tartrate (LOPRESSOR) injection 5 mg (5 mg Intravenous Given 04/20/24 1211)    Clinical Course as of 04/20/24 1531  Wed Apr 20, 2024  1126 68 yo male w/ hx of HTN, paroxysmal A-fib, on Eliquis now  for 30 days, presented to ED with palpitations that began last night.  Patient noted on his heart rate monitor that his typical resting heart rate, which is in the 60s, and elevated in the 130s.  He was concerned about being back in A-fib.  He says he was hospitalized in [place]  about a month ago for his first episode of A-fib.  At that time he underwent TEE and overnight observation stay, with electrical cardioversion.  He says they are not able to rate control him otherwise on IV medicines.  He was subsequently discharged Eliquis, has been compliant on it, and was started on metoprolol as well 25 mg daily, which he has been taking.  He is also on lisinopril and HCTZ for blood pressure chronically.  He says he does not have any known coronary history.  He denies shortness of breath, fevers, chills, cough.  On exam the patient is afebrile, is tachycardic with heart rate in the 130s and 140s, has no respiratory distress,  no hypoxia, no wheezing on exam.  We will check the patient's labs.  EKG shows an A-fib or a flutter pattern.  We will try IV rate control first, to see if he spontaneously cardiovert, but he may be a candidate for electrical cardioversion, given that he has been compliant on his Eliquis and had stroke/echo workup last month [MT]  1221 On repeat evaluation, patient did convert with 5 mg of Lopressor.  His palpitations have resolved.  He does appear to have normal sinus rhythm on monitor.  Blood pressure good. [CF]  1529 CBC with Differential(!) Mild leukocytosis.  [CF]  1530 Basic metabolic panel(!) Normal.  [CF]  1530 Troponin I (High Sensitivity) Single troponin is normal.  [CF]  1530 DG Chest 2 View No signs of pulmonary edema or pneumonia.  I do agree with radiologist interpretation. [CF]    Clinical Course User Index [CF] Theotis Cameron HERO, PA-C [MT] Cottie Donnice PARAS, MD    Medical Decision Making Susman MCKIM is a 68 y.o. male patient who presents to the emergency department  today for further evaluation of tachycardia.  Patient came in he is normotensive but tachycardia.  This is narrow complex tachycardia.  Likely atrial flutter versus sinus tachycardia versus atrial fibrillation.  Will plan to get some basic labs.  Do not feel the patient meets cardioversion criteria at this moment considering his blood pressure is normal.  He has been taking his Eliquis and has not missed any doses so does meet criteria for need to go down the road.  Will likely try to treat this medically.  Patient received 5 of Lopressor given the patient's soft but normal blood pressure and he converted.  He has been in normal sinus rhythm for 1.5 hours.  Patient was able to ambulate without any difficulty or shortness of breath or chest pain.  Patient to follow-up with cardiology and/or primary care doctor in the outpatient setting.  He is safe for discharge at this time.   Amount and/or Complexity of Data Reviewed Labs: ordered. Radiology: ordered.  Risk Prescription drug management.     Final diagnoses:  Atrial fibrillation with RVR [facility])    ED Discharge Orders          Ordered    lisinopril-hydrochlorothiazide  (ZESTORETIC) 20-12.5 MG tablet  Daily        04/20/24 1436               Theotis Cameron [place], [place] 04/20/24 1531    Cottie Donnice PARAS, MD 04/20/24 [phone]

## 2024-04-20 NOTE — ED Triage Notes (Signed)
 Pt states he was diagnosed with atrial flutter and had a cardioversion a month ago. Pt has been using cardiomobile equipment which read tachycardia and sent to cardiologist. Today pt woke up because he felt palpitations, sent it to MD and was told to come to ED for eval. Pt denies pain or shortness of breath.

## 2024-04-20 NOTE — ED Notes (Addendum)
 Pt walked to restroom and back.  Heart rate was 75 upon return.  NT reports Pt's heart rate remained in 90s, when she ambulated him.

## 2024-04-20 NOTE — ED Notes (Signed)
 ED Provider at bedside.

## 2024-04-20 NOTE — ED Notes (Signed)
 When pt stood up HR was 115, while ambulating o2 was 93 and HR was 98-99 and hooked to monitor HR is 87

## 2024-04-20 NOTE — ED Provider Triage Note (Signed)
 Emergency Medicine Provider Triage Evaluation Note  Michael Parsons , a 68 y.o. male  was evaluated in triage.  Pt complains of tachycardia. Hx of aflutter. On eliquis and metoprolol. States he has had palpitations since 1:30 AM   Review of Systems  Positive: Palpitations, sob Negative: Chest pain   Physical Exam  BP 114/80 (BP Location: Left Arm)   Pulse (!) 134   Temp 98.4 F (36.9 C) (Oral)   Resp 18   Ht 6' (1.829 m)   Wt 112.5 kg   SpO2 98%   BMI 33.63 kg/m  Gen:   Awake, no distress   Resp:  Normal effort  MSK:   Moves extremities without difficulty    Medical Decision Making  Medically screening exam initiated at 10:56 AM.  Appropriate orders placed.  Michael Parsons was informed that the remainder of the evaluation will be completed by another provider, this initial triage assessment does not replace that evaluation, and the importance of remaining in the ED until their evaluation is complete.     Michael Parsons L, DO 04/20/24 1057

## 2024-04-20 NOTE — Telephone Encounter (Signed)
 Pt is asking the nurse please check his mychart message. Please advise.   Patient c/o Palpitations:  STAT if patient reporting lightheadedness, shortness of breath, or chest pain  How long have you had palpitations/irregular HR/ Afib? Are you having the symptoms now? Since 1:30 AM   Are you currently experiencing lightheadedness, SOB or CP? No   Do you have a history of afib (atrial fibrillation) or irregular heart rhythm? No   Have you checked your BP or HR? (document readings if available): 130 HR 126/76 BP at 6am   Are you experiencing any other symptoms? No   STAT if HR is under 50 or over 120 (normal HR is 60-100 beats per minute)  What is your heart rate? 130  Do you have a log of your heart rate readings (document readings)? Yes, watvh   Do you have any other symptoms? No   Please advise. Call transferred

## 2024-04-20 NOTE — Telephone Encounter (Signed)
 Pt also called. Please see phone encounter for more information.

## 2024-04-27 NOTE — Progress Notes (Unsigned)
  Electrophysiology Office Note:   Date:  04/27/2024  ID:  Michael Parsons, DOB 1956/06/05, MRN [id number]  Primary Cardiologist: None Electrophysiologist: None    {Click to update primary MD,subspecialty MD or APP then REFRESH:1}    History of Present Illness:   Michael Parsons is a 68 y.o. male with h/o AFL, HTN, and OSA seen today for routine electrophysiology followup.   Seen by Dr. Nancey 04/13/2024 to consider CTI. We were awaiting documentation of his flutter from prior admission in [place] .     Seen in ED 10/29. EKG shows flutter and EP follow up made.   Since last being seen in our clinic the patient reports doing ***.  he denies chest pain, palpitations, dyspnea, PND, orthopnea, nausea, vomiting, dizziness, syncope, edema, weight gain, or early satiety.   Review of systems complete and found to be negative unless listed in HPI.   EP Information / Studies Reviewed:    EKG is not ordered today. EKG from 04/20/2024 reviewed which showed Atrial flutter with RVR       Arrhythmia/Device History No specialty comments available.   Physical Exam:   VS:  There were no vitals taken for this visit.   Wt Readings from Last 3 Encounters:  04/20/24 248 lb (112.5 kg)  04/13/24 250 lb 1.6 oz (113.4 kg)  07/10/23 265 lb 3.2 oz (120.3 kg)     GEN: No acute distress NECK: No JVD; No carotid bruits CARDIAC: {EPRHYTHM:28826}, no murmurs, rubs, gallops RESPIRATORY:  Clear to auscultation without rales, wheezing or rhonchi  ABDOMEN: Soft, non-tender, non-distended EXTREMITIES:  {EDEMA LEVEL:28147::No} edema; No deformity   ASSESSMENT AND PLAN:    Atrial flutter Now with documentation given ER visit.  EKG today shows ***     Secondary hypercoagulable state Continue eliquis 5 mg BID for CHA2DS2-VASc score is 3   HTN Stable on current regimen   OSA  Encouraged nightly CPAP   {Click here to Review PMH, Prob List, Meds, Allergies, SHx, FHx  :1}   Follow up with  {EPMDS:28135::EP Team} {EPFOLLOW UP:28173}  Signed, Ozell [name], PA-C

## 2024-04-28 ENCOUNTER — Encounter: Payer: Self-pay | Admitting: Student

## 2024-04-28 ENCOUNTER — Ambulatory Visit: Attending: Student | Admitting: Student

## 2024-04-28 VITALS — BP 122/72 | HR 67 | Ht 72.0 in | Wt 240.0 lb

## 2024-04-28 DIAGNOSIS — G4733 Obstructive sleep apnea (adult) (pediatric): Secondary | ICD-10-CM | POA: Diagnosis present

## 2024-04-28 DIAGNOSIS — I10 Essential (primary) hypertension: Secondary | ICD-10-CM | POA: Insufficient documentation

## 2024-04-28 DIAGNOSIS — I483 Typical atrial flutter: Secondary | ICD-10-CM | POA: Insufficient documentation

## 2024-04-28 DIAGNOSIS — I4891 Unspecified atrial fibrillation: Secondary | ICD-10-CM

## 2024-04-28 LAB — CBC

## 2024-04-28 MED ORDER — METOPROLOL TARTRATE 25 MG PO TABS: 25.0000 mg | ORAL_TABLET | ORAL | 3 refills | Status: DC | PRN

## 2024-04-28 NOTE — Patient Instructions (Addendum)
 Medication Instructions:  Start metoprolol tartrate (Lopressor) 25 mg as needed for atrial flutter or heart rates greater than 110 beats per minute *If you need a refill on your cardiac medications before your next appointment, please call your pharmacy*  Lab Work: BMET, CBC-TODAY If you have labs (blood work) drawn today and your tests are completely normal, you will receive your results only by: MyChart Message (if you have MyChart) OR A paper copy in the mail If you have any lab test that is abnormal or we need to change your treatment, we will call you to review the results.  Testing/Procedures: See letter  Follow-Up: At [facility], you and your health needs are our priority.  As part of our continuing mission to provide you with exceptional heart care, our providers are all part of one team.  This team includes your primary Cardiologist (physician) and Advanced Practice Providers or APPs (Physician Assistants and Nurse Practitioners) who all work together to provide you with the care you need, when you need it.  Your next appointment:   Follow up will be coordinated for you and print out on your discharge summary after your procedure

## 2024-04-29 ENCOUNTER — Telehealth (HOSPITAL_COMMUNITY): Payer: Self-pay

## 2024-04-29 ENCOUNTER — Encounter (HOSPITAL_COMMUNITY): Payer: Self-pay

## 2024-04-29 ENCOUNTER — Ambulatory Visit: Payer: Self-pay | Admitting: Student

## 2024-04-29 LAB — CBC
Hematocrit: 48.3 % (ref 37.5–51.0)
Hemoglobin: 16.1 g/dL (ref 13.0–17.7)
MCH: 31.6 pg (ref 26.6–33.0)
MCHC: 33.3 g/dL (ref 31.5–35.7)
MCV: 95 fL (ref 79–97)
Platelets: 207 x10E3/uL (ref 150–450)
RBC: 5.09 x10E6/uL (ref 4.14–5.80)
RDW: 12.7 % (ref 11.6–15.4)
WBC: 7.1 x10E3/uL (ref 3.4–10.8)

## 2024-04-29 LAB — BASIC METABOLIC PANEL WITH GFR
BUN/Creatinine Ratio: 18 (ref 10–24)
BUN: 14 mg/dL (ref 8–27)
CO2: 21 mmol/L (ref 20–29)
Calcium: 9.6 mg/dL (ref 8.6–10.2)
Chloride: 98 mmol/L (ref 96–106)
Creatinine, Ser: 0.78 mg/dL (ref 0.76–1.27)
Glucose: 91 mg/dL (ref 70–99)
Potassium: 4.3 mmol/L (ref 3.5–5.2)
Sodium: 137 mmol/L (ref 134–144)
eGFR: 97 mL/min/1.73 (ref >59)

## 2024-04-29 NOTE — Telephone Encounter (Signed)
 Spoke with patient to complete pre-procedure call.     Health status review:  Any new medical conditions, recent signs of acute illness or been started on antibiotics? No Any recent hospitalizations or surgeries? No Any new medications started since pre-op visit? No  Follow all medication instructions prior to procedure or the procedure may be rescheduled:    Continue taking Eliquis (Apixaban) twice daily without missing any doses before procedure. Essential chronic medications:  No medication should be continued, unless told otherwise.  HOLD: Metoprolol for 3 days prior to your procedure. Last dose will be on Friday, November 28. On the morning of your procedure DO NOT take any medication., including Eliquis (Apixaban).  Nothing to eat or drink after midnight prior to your procedure.  Pre-procedure testing scheduled: CT not needed and lab work completed.   Confirmed patient is scheduled for Atrial Flutter Ablation on Tuesday, December 2 with Dr. Nancey. Instructed patient to arrive at the Main Entrance A at [facility]: [address] [place], [place] [address] and check in at Admitting at 5:30 AM.  Plan to go home the same day, you will only stay overnight if medically necessary. You MUST have a responsible adult to drive you home and MUST be with you the first 24 hours after you arrive home or your procedure could be cancelled.  Informed a nurse may call a day before the procedure to confirm arrival time and ensure instructions are followed.  Patient verbalized understanding to information provided and is agreeable to proceed with procedure.   Advised to contact RN Navigator at [phone], to inform of any new medications started after call or concerns prior to procedure.

## 2024-05-03 ENCOUNTER — Telehealth: Payer: Self-pay | Admitting: Student

## 2024-05-03 DIAGNOSIS — I483 Typical atrial flutter: Secondary | ICD-10-CM

## 2024-05-03 MED ORDER — METOPROLOL TARTRATE 25 MG PO TABS: 25.0000 mg | ORAL_TABLET | Freq: Two times a day (BID) | ORAL | Status: AC | PRN

## 2024-05-03 MED ORDER — METOPROLOL SUCCINATE ER 50 MG PO TB24: 50.0000 mg | ORAL_TABLET | Freq: Every day | ORAL | Status: DC

## 2024-05-03 NOTE — Telephone Encounter (Signed)
 Received incoming STAT Call from pt.   Pt states he has been in the ER twice in last 1.5 months. Scheduled for ablation on 05/24/24. He took his Toprol-XL 25 mg today at 9 am. His HR was elevated (in 130's) this morning, and so he took one dose of the Metoprolol tartrate, 25 mg at 9:30 am. He waited about one hour, HR dropped to 70's, but then around 12:30 pm, his HR was back up to 130's. BP has been WNL, only slightly elevated.   Denies Chest pain, SOB, dizziness/lightheadedness. Only symptom is, he sometimes can feel palpitations. He is also very anxious. He has been staying hydrated. He has avoided caffeine and ETOH for at least one week.   After reaching out to [name], [name], pt was advised to:  -Increase Metoprolol Succinate (Toprol-XL) to 50 mg once daily; he should take an additional 25 mg now and then start new dose on 05/04/24.   -He should wait about one hour, if HR continues to be elevated, he should take an additional Metoprolol Tartrate (Lopressor) 25 mg.   -He can call us  back and ask to speak to the on call team with additional concerns, if any.   Pt verbalized understanding of the above.

## 2024-05-03 NOTE — Telephone Encounter (Signed)
 STAT if HR is under 50 or over 120  (normal HR is 60-100 beats per minute)  What is your heart rate? 134  Do you have a log of your heart rate readings (document readings)? 187, 130, 100, 130   Do you have any other symptoms? no   Call transferred

## 2024-05-04 ENCOUNTER — Ambulatory Visit (INDEPENDENT_AMBULATORY_CARE_PROVIDER_SITE_OTHER): Admitting: Neurology

## 2024-05-04 ENCOUNTER — Encounter: Payer: Self-pay | Admitting: Neurology

## 2024-05-04 VITALS — BP 114/68 | HR 139 | Ht 72.0 in | Wt 240.0 lb

## 2024-05-04 DIAGNOSIS — I499 Cardiac arrhythmia, unspecified: Secondary | ICD-10-CM

## 2024-05-04 DIAGNOSIS — G4733 Obstructive sleep apnea (adult) (pediatric): Secondary | ICD-10-CM

## 2024-05-04 DIAGNOSIS — R000 Tachycardia, unspecified: Secondary | ICD-10-CM | POA: Diagnosis not present

## 2024-05-04 DIAGNOSIS — Z789 Other specified health status: Secondary | ICD-10-CM | POA: Diagnosis not present

## 2024-05-04 NOTE — Progress Notes (Signed)
 Subjective:    Patient ID: Michael Parsons is a 68 y.o. male.  HPI    Interim history:   Michael Parsons is a 68 year old right-handed gentleman with an underlying medical history of hypertension, hyperlipidemia, BPH, allergies, asthma, A-fib/atrial flutter, history of DVT, reflux disease, degenerative spine disease with status post neck and lower back surgeries, hypothyroidism, and obesity, who presents for follow-up consultation of Michael Parsons obstructive sleep apnea, established on PAP therapy.  The patient is accompanied by Michael Parsons significant other today and presents for Michael Parsons 1 year checkup.  He was last seen in a video visit by Duwaine Russell, NP in November 2024, at which time he was compliant with Michael Parsons PAP machine.  He was advised to follow-up routinely in 1 year.  Today, 05/04/2024: I reviewed Michael Parsons PAP compliance data from 04/03/2024 through 05/02/2024, which is a total of 30 days, during which time he used Michael Parsons machine 27 days with percent use days greater than 4 hours at 87%, indicating very good compliance with an average usage of 7 hours and 5 minutes, residual AHI at goal at 1.9/h, leak on the high side fairly consistently with the 95th percentile at 43.5 L/min on a pressure of 13 cm with EPR of 3.  He does have mouth dryness and the leak is high.  Michael Parsons heart rate is elevated today, likely due to being in A-fib/a flutter.   Michael Parsons DME supplier is [employer] and Michael Parsons set up date for Michael Parsons CPAP is 07/21/2019. He reports that he used nasal pillows for years, but in the past year, Michael Parsons leak from the mouth has increased and that bothers him.  He has not made an appointment yet with Michael Parsons DME provider to get a different mask fitted.  He would be willing to make an appointment with them for mask fit and I also was able to provide him with a ResMed AirFit F30i hybrid style fullface mask sample pack today that he can take home and try starting tonight.  He does report feeling nervous and anxious and it has impacted Michael Parsons  ability to fall asleep at night, he feels Michael Parsons palpitations at night.  Michael Parsons metoprolol long-acting was recently increased from 25 to 50 mg just yesterday and he also has a immediate release metoprolol prescription 25 mg strength that he can take up to 2 pills as needed.  He has only taken Michael Parsons long-acting metoprolol this morning and does feel tachycardic. Of note, he is scheduled for atrial flutter ablation in December 2025.  Previously:  05/05/2023 Johnnie Russell, NP): <<Michael Parsons is a 68 y.o. male with a history of OSA on CPAP. Returns today for follow-up.  Reports that CPAP is working well for him.  Denies any new issues.  Download is below>>  04/23/2022 Johnnie Russell, NP): << Michael Parsons is a 68 year old male with a history of obstructive sleep apnea on CPAP.  He returns today for follow-up.  He reports that the CPAP works well for him.  He also has a travel CPAP.  This explains the gap in Michael Parsons usage.  Reports that the travel CPAP makes a noise but he is now used to it.  Denies any trouble sleeping.  Denies any significant daytime sleepiness although he does report he tends to get sleepy after lunch.>>  04/23/2021 (MM): <<Michael Parsons is a 68 year old male with a history of obstructive sleep apnea on CPAP.  He returns today for follow-up.  He reports that the CPAP is working well.  He also has  a travel machine that he uses.  He returns today for an evaluation.>>  01/31/20 (MM): <<Michael Parsons is a 68 year old male with a history of obstructive sleep apnea on CPAP.  Michael Parsons download indicates that he uses machine nightly for compliance of 100%.  He uses machine greater than 4 hours each night.  On average he uses Michael Parsons machine 7 hours and 23 minutes.  Michael Parsons residual AHI is 4.9 on 13 cm of water with EPR 3.  Leak in the 95th percentile is 13.8 L/min.  He reports that the CPAP is working well.  Returns today for an evaluation.>>  10/18/2019 (SA): (He) was diagnosed with moderate obstructive sleep apnea and  1998.  He had a sleep study on 04/26/1997 with an AHI of 27/h.  He has been on CPAP therapy since 2006 and had seen Dr. Tammy for sleep and primary care in the past. He recently received a new CPAP machine, in Feb 2021.  I reviewed your virtual visit note from 09/21/2019.  I was able to review Michael Parsons compliance data from 09/03/2019 through 10/02/2019, during which time he used Michael Parsons machine every night with percent use days greater than 4 hours at 100%, indicating superb compliance, average usage of over 7 hours, average AHI at goal at 1.9/h, leak acceptable with a 95th percentile at 11.2 L/min on a pressure of 13 cm with EPR of 3.  He reports that when he was initially diagnosed with obstructive sleep apnea he could not tolerate the mask.  Since 2006 he has been fully compliant with CPAP therapy.  Michael Parsons original pressure was 11 cm.  He tolerates the 13 cm.  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  He is divorced, he lives alone, currently Michael Parsons daughter is staying with him.  He has 3 children.  He typically goes to bed around 10 and rise time is around 6.  He does not have night to night nocturia and reports no recurrent morning headaches, sleeps well with Michael Parsons CPAP, Epworth sleepiness score is 6 out of 24, fatigue severity score is 21 out of 63.  He does watch TV in Michael Parsons bedroom but turns it off at night.  He has 3 dogs in the household, one of them sleeps on the floor in Michael Parsons bedroom at night.  He reports a family history of sleep apnea, Michael Parsons mother, age 37 has sleep apnea and uses a CPAP machine.  Father is 68 years old.  He drinks caffeine in the form of coffee, 4 to 5 cups/day.  Michael Parsons Past Medical History Is Significant For: Past Medical History:  Diagnosis Date   Arthritis    carpel tunnel   Asthma    does not have to use rescue inhaler often   Asthma    BPH (benign prostatic hyperplasia)    Deviated septum    DVT (deep venous thrombosis) (HCC)    GERD (gastroesophageal reflux  disease)    Hyperlipidemia    Hypertension    Hypothyroidism    Hypothyroidism    Sleep apnea    cpap at night    Michael Parsons Past Surgical History Is Significant For: Past Surgical History:  Procedure Laterality Date   ANTERIOR CERVICAL DECOMP/DISCECTOMY FUSION N/A 11/11/2012   Procedure: ANTERIOR CERVICAL DECOMPRESSION/DISCECTOMY FUSION 2 LEVELS;  Surgeon: Alm GORMAN Molt, MD;  Location: [facility];  Service: Neurosurgery;  Laterality: N/A;  Cervical five-six,Cervical six-seven   BACK SURGERY  04/2016   lumar didcectomy  CARDIOVERSION     HERNIA REPAIR  2007   umbilical    Michael Parsons Family History Is Significant For: Family History  Problem Relation Age of Onset   Sleep apnea Mother    Asthma Mother     Michael Parsons Social History Is Significant For: Social History   Socioeconomic History   Marital status: Single    Spouse name: Not on file   Number of children: Not on file   Years of education: Not on file   Highest education level: Not on file  Occupational History   Not on file  Tobacco Use   Smoking status: Never   Smokeless tobacco: Never  Vaping Use   Vaping status: Never Used  Substance and Sexual Activity   Alcohol use: Not Currently   Drug use: No   Sexual activity: Not on file  Other Topics Concern   Not on file  Social History Narrative   Drinks decaf coffee now   Social Drivers of Corporate Investment Banker Strain: Not on file  Food Insecurity: Not on file  Transportation Needs: Not on file  Physical Activity: Not on file  Stress: Not on file  Social Connections: Not on file    Michael Parsons Allergies Are:  Allergies  Allergen Reactions   Sulfa Antibiotics Other (See Comments)    Unknown reaction  :   Michael Parsons Current Medications Are:  Outpatient Encounter Medications as of 05/04/2024  Medication Sig   albuterol  (VENTOLIN  HFA) 108 (90 Base) MCG/ACT inhaler 2 puffs Inhalation 4x a day PRN for shortness of breath   budesonide -formoterol  (SYMBICORT ) 160-4.5 MCG/ACT  inhaler Inhale 2 puffs into the lungs in the morning and at bedtime.   cetirizine (ZYRTEC) 10 MG tablet Take 10 mg by mouth daily as needed for allergies.    ELIQUIS 5 MG TABS tablet Take 5 mg by mouth 2 (two) times daily.   levothyroxine  (SYNTHROID , LEVOTHROID) 88 MCG tablet Take 88 mcg by mouth at bedtime.  (Patient taking differently: Take 88 mcg by mouth daily before breakfast.)   lisinopril (ZESTRIL) 20 MG tablet Take 20 mg by mouth daily.   metoprolol succinate (TOPROL-XL) 50 MG 24 hr tablet Take 1 tablet (50 mg total) by mouth daily.   metoprolol tartrate (LOPRESSOR) 25 MG tablet Take 1 tablet (25 mg total) by mouth 2 (two) times daily as needed. For recurrent atrial flutter, HRs > 110 bpm.   montelukast  (SINGULAIR ) 10 MG tablet TAKE 1 TABLET AT BEDTIME   omeprazole (PRILOSEC) 20 MG capsule Take 20 mg by mouth daily.   rosuvastatin (CRESTOR) 10 MG tablet Take 10 mg by mouth daily.   tamsulosin (FLOMAX) 0.4 MG CAPS capsule Take 0.4 mg by mouth daily.   [DISCONTINUED] metoprolol succinate (TOPROL-XL) 25 MG 24 hr tablet Take 50 mg by mouth daily.   [DISCONTINUED] metoprolol tartrate (LOPRESSOR) 25 MG tablet Take 1 tablet (25 mg total) by mouth as needed. For recurrent atrial flutter, HRs > 110 bpm.   No facility-administered encounter medications on file as of 05/04/2024.  :  Review of Systems:  Out of a complete 14 point review of systems, all are reviewed and negative with the exception of these symptoms as listed below:  Review of Systems  Objective:  Neurological Exam  Physical Exam Physical Examination:   Vitals:   05/04/24 0948  BP: 114/68  Pulse: (!) 139  SpO2: 98%    General Examination: The patient is a very pleasant 68 y.o. male in no acute distress. He appears  well-developed and well-nourished and well groomed.   HEENT: Normocephalic, atraumatic, pupils are equal, round and reactive to light, extraocular tracking is well-preserved, corrective eyeglasses in place,  hearing grossly intact.  Face is symmetric with normal facial animation, speech without dysarthria, hypophonia or voice tremor.  Airway examination reveals mild mouth dryness, otherwise stable findings.  Tongue protrudes centrally and palate elevates symmetrically.    Chest: Clear to auscultation without wheezing, rhonchi or crackles noted.   Heart: S1+S2+0, slightly irregular, tachycardic.      Abdomen: Soft, non-tender and non-distended with normal bowel sounds appreciated on auscultation.   Extremities: There is no obvious edema in the distal lower extremities bilaterally.    Skin: Warm and dry without trophic changes noted.    Musculoskeletal: exam reveals no obvious joint deformities, tenderness or joint swelling or erythema.    Neurologically:  Mental status: The patient is awake, alert and oriented in all 4 spheres. Michael Parsons immediate and remote memory, attention, language skills and fund of knowledge are appropriate. There is no evidence of aphasia, agnosia, apraxia or anomia. Speech is clear with normal prosody and enunciation. Thought process is linear. Mood is normal and affect is normal.  Cranial nerves II - XII are as described above under HEENT exam.  Motor exam: Normal bulk, moving all 4 extremities without restriction, no obvious action or resting tremor.   Fine motor skills and coordination: grossly intact.  Cerebellar testing: No dysmetria or intention tremor. There is no truncal or gait ataxia.  Sensory exam: intact to light touch.  Gait, station and balance: He stands easily. No veering to one side is noted. No leaning to one side is noted. Posture is age-appropriate and stance is narrow based. Gait shows normal stride length and normal pace. No problems turning are noted.    Assessment and Plan:  In summary, AMAR SIPPEL is a 68 year old right-handed gentleman with an underlying medical history of hypertension, hyperlipidemia, BPH, allergies, asthma, A-fib/atrial flutter,  history of DVT, reflux disease, degenerative spine disease with status post neck and lower back surgeries, hypothyroidism, and obesity, who presents for follow-up consultation of Michael Parsons obstructive sleep apnea, established on CPAP therapy for years.  He continues to be compliant with treatment but leak has increased from the mouth and he has tried a chinstrap which did not help and is uncomfortable.  Lately, he has had some trouble falling asleep, likely due to stress from Michael Parsons recent diagnosis of A-fib/flutter.  He has an ablation pending for next month.  Michael Parsons pressure settings are good, compliance is good, he is commended for Michael Parsons treatment adherence and advised to make an appointment with Michael Parsons DME provider for a mask fit and consider a different style mask such as a hybrid style fullface mask.  We talked about reasons why mouth dryness may have increased and air leakage from the mask may have increased over time.  He is given a sample mask today to try and if he likes that he can reorder it through Michael Parsons DME provider or consider a different option through them.  He is advised to follow-up routinely in our sleep clinic to see Duwaine Russell, NP in 1 year.  I answered all their questions today and the patient and Michael Parsons significant other were in agreement. I spent 40 minutes in total face-to-face time and in reviewing records during pre-charting, more than 50% of which was spent in counseling and coordination of care, reviewing test results, reviewing medications and treatment regimen and/or in discussing or reviewing  the diagnosis of OSA, the prognosis and treatment options. Pertinent laboratory and imaging test results that were available during this visit with the patient were reviewed by me and considered in my medical decision making (see chart for details).

## 2024-05-04 NOTE — Patient Instructions (Signed)
 It was nice to see you again today.  I wish you good luck with your ablation procedure next month.  I hopeful that everything will go well for you and that you will feel better soon.  Continue to use your CPAP regularly, pressure settings are good, mask leak is notable and you may be able to tolerate the under the nose style fullface mask.  Please trim your beard a little bit as well if possible and trial of the F30i fullface mask that I provided, the medium insert is already fitted.  I have also placed an order for [facility] to reach out to you for a formal mask fit appointment to discuss other options if need be.

## 2024-05-04 NOTE — Progress Notes (Signed)
 SABRA

## 2024-05-10 ENCOUNTER — Telehealth: Payer: Medicare Other | Admitting: Adult Health

## 2024-05-10 NOTE — Progress Notes (Signed)
 Community message has been sent to [employer] for mask and supplies on 05/10/24. DD

## 2024-05-23 NOTE — Pre-Procedure Instructions (Signed)
 Instructed patient on the following items: Arrival time 0515 Nothing to eat or drink after midnight No meds AM of procedure Responsible person to drive you home and stay with you for 24 hrs  Have you missed any doses of anti-coagulant Eliquis- takes twice a day, hasn't missed any doses in last 4 weeks.  Don't take dose morning of procedure.

## 2024-05-24 ENCOUNTER — Ambulatory Visit (HOSPITAL_COMMUNITY)
Admission: RE | Disposition: A | Discharge status: Home / Self Care | Payer: Self-pay | Source: Home / Self Care | Attending: Cardiovascular Disease

## 2024-05-24 ENCOUNTER — Ambulatory Visit (HOSPITAL_COMMUNITY): Payer: Self-pay

## 2024-05-24 ENCOUNTER — Ambulatory Visit (HOSPITAL_COMMUNITY)
Admission: RE | Admit: 2024-05-24 | Discharge: 2024-05-24 | Disposition: A | Discharge status: Home / Self Care | Attending: Cardiovascular Disease | Admitting: Cardiovascular Disease

## 2024-05-24 ENCOUNTER — Other Ambulatory Visit: Payer: Self-pay

## 2024-05-24 ENCOUNTER — Encounter: Payer: Self-pay | Admitting: Cardiovascular Disease

## 2024-05-24 DIAGNOSIS — I4892 Unspecified atrial flutter: Secondary | ICD-10-CM | POA: Diagnosis not present

## 2024-05-24 DIAGNOSIS — G4733 Obstructive sleep apnea (adult) (pediatric): Secondary | ICD-10-CM | POA: Insufficient documentation

## 2024-05-24 DIAGNOSIS — E039 Hypothyroidism, unspecified: Secondary | ICD-10-CM | POA: Insufficient documentation

## 2024-05-24 DIAGNOSIS — D6869 Other thrombophilia: Secondary | ICD-10-CM | POA: Insufficient documentation

## 2024-05-24 DIAGNOSIS — I483 Typical atrial flutter: Secondary | ICD-10-CM | POA: Insufficient documentation

## 2024-05-24 DIAGNOSIS — I480 Paroxysmal atrial fibrillation: Secondary | ICD-10-CM | POA: Insufficient documentation

## 2024-05-24 DIAGNOSIS — J45909 Unspecified asthma, uncomplicated: Secondary | ICD-10-CM | POA: Insufficient documentation

## 2024-05-24 DIAGNOSIS — K219 Gastro-esophageal reflux disease without esophagitis: Secondary | ICD-10-CM | POA: Insufficient documentation

## 2024-05-24 DIAGNOSIS — I10 Essential (primary) hypertension: Secondary | ICD-10-CM | POA: Insufficient documentation

## 2024-05-24 DIAGNOSIS — Z79899 Other long term (current) drug therapy: Secondary | ICD-10-CM | POA: Insufficient documentation

## 2024-05-24 DIAGNOSIS — Z7901 Long term (current) use of anticoagulants: Secondary | ICD-10-CM | POA: Insufficient documentation

## 2024-05-24 DIAGNOSIS — G4730 Sleep apnea, unspecified: Secondary | ICD-10-CM | POA: Insufficient documentation

## 2024-05-24 HISTORY — PX: A-FLUTTER ABLATION: EP1230

## 2024-05-24 LAB — POCT ACTIVATED CLOTTING TIME: Activated Clotting Time: 250 s

## 2024-05-24 SURGERY — A-FLUTTER ABLATION
Anesthesia: General

## 2024-05-24 MED ORDER — DEXAMETHASONE SOD PHOSPHATE PF 10 MG/ML IJ SOLN
INTRAMUSCULAR | Status: DC | PRN
  Administered 2024-05-24: 5 mg via INTRAVENOUS

## 2024-05-24 MED ORDER — SUGAMMADEX SODIUM 200 MG/2ML IV SOLN
INTRAVENOUS | Status: DC | PRN
  Administered 2024-05-24: 200 mg via INTRAVENOUS

## 2024-05-24 MED ORDER — ONDANSETRON HCL 4 MG/2ML IJ SOLN: 4.0000 mg | Freq: Once | INTRAMUSCULAR | Status: DC | PRN

## 2024-05-24 MED ORDER — ACETAMINOPHEN 10 MG/ML IV SOLN: 1000.0000 mg | Freq: Once | INTRAVENOUS | Status: DC | PRN

## 2024-05-24 MED ORDER — FENTANYL CITRATE (PF) 100 MCG/2ML IJ SOLN
INTRAMUSCULAR | Status: AC
  Filled 2024-05-24: qty 2

## 2024-05-24 MED ORDER — PROPOFOL 10 MG/ML IV BOLUS
INTRAVENOUS | Status: DC | PRN
  Administered 2024-05-24: 70 mg via INTRAVENOUS
  Administered 2024-05-24: 130 mg via INTRAVENOUS

## 2024-05-24 MED ORDER — ATROPINE SULFATE 1 MG/10ML IJ SOSY
PREFILLED_SYRINGE | INTRAMUSCULAR | Status: AC
  Filled 2024-05-24: qty 10

## 2024-05-24 MED ORDER — LIDOCAINE 2% (20 MG/ML) 5 ML SYRINGE
INTRAMUSCULAR | Status: DC | PRN
  Administered 2024-05-24: 70 mg via INTRAVENOUS

## 2024-05-24 MED ORDER — OXYCODONE HCL 5 MG PO TABS: 5.0000 mg | ORAL_TABLET | Freq: Once | ORAL | Status: DC | PRN

## 2024-05-24 MED ORDER — FENTANYL CITRATE (PF) 100 MCG/2ML IJ SOLN: 25.0000 ug | INTRAMUSCULAR | Status: DC | PRN

## 2024-05-24 MED ORDER — HEPARIN SODIUM (PORCINE) 1000 UNIT/ML IJ SOLN
INTRAMUSCULAR | Status: AC
  Filled 2024-05-24: qty 10

## 2024-05-24 MED ORDER — PROTAMINE SULFATE 10 MG/ML IV SOLN
INTRAVENOUS | Status: DC | PRN
  Administered 2024-05-24: 50 mg via INTRAVENOUS

## 2024-05-24 MED ORDER — ONDANSETRON HCL 4 MG/2ML IJ SOLN
INTRAMUSCULAR | Status: DC | PRN
  Administered 2024-05-24: 4 mg via INTRAVENOUS

## 2024-05-24 MED ORDER — CEFAZOLIN SODIUM-DEXTROSE 2-4 GM/100ML-% IV SOLN
INTRAVENOUS | Status: DC
  Filled 2024-05-24: qty 100

## 2024-05-24 MED ORDER — HEPARIN SODIUM (PORCINE) 1000 UNIT/ML IJ SOLN
INTRAMUSCULAR | Status: DC | PRN
  Administered 2024-05-24: 4000 [IU] via INTRAVENOUS
  Administered 2024-05-24: 18000 [IU] via INTRAVENOUS

## 2024-05-24 MED ORDER — ROCURONIUM BROMIDE 10 MG/ML (PF) SYRINGE
PREFILLED_SYRINGE | INTRAVENOUS | Status: DC | PRN
  Administered 2024-05-24: 70 mg via INTRAVENOUS
  Administered 2024-05-24: 20 mg via INTRAVENOUS

## 2024-05-24 MED ORDER — MIDAZOLAM HCL (PF) 2 MG/2ML IJ SOLN
INTRAMUSCULAR | Status: DC | PRN
  Administered 2024-05-24: 2 mg via INTRAVENOUS

## 2024-05-24 MED ORDER — OXYCODONE HCL 5 MG/5ML PO SOLN: 5.0000 mg | Freq: Once | ORAL | Status: DC | PRN

## 2024-05-24 MED ORDER — FENTANYL CITRATE (PF) 250 MCG/5ML IJ SOLN
INTRAMUSCULAR | Status: DC | PRN
  Administered 2024-05-24: 100 ug via INTRAVENOUS

## 2024-05-24 MED ORDER — PHENYLEPHRINE HCL-NACL 20-0.9 MG/250ML-% IV SOLN
INTRAVENOUS | Status: DC | PRN
  Administered 2024-05-24: 40 ug/min via INTRAVENOUS

## 2024-05-24 MED ORDER — HEPARIN (PORCINE) IN NACL 1000-0.9 UT/500ML-% IV SOLN
INTRAVENOUS | Status: DC | PRN
  Administered 2024-05-24 (×3): 500 mL

## 2024-05-24 MED ORDER — ATROPINE SULFATE 1 MG/10ML IJ SOSY
PREFILLED_SYRINGE | INTRAMUSCULAR | Status: DC | PRN
  Administered 2024-05-24: 1 mg via INTRAVENOUS

## 2024-05-24 MED ORDER — MIDAZOLAM HCL 2 MG/2ML IJ SOLN
INTRAMUSCULAR | Status: AC
  Filled 2024-05-24: qty 2

## 2024-05-24 MED ORDER — SODIUM CHLORIDE 0.9% FLUSH: 3.0000 mL | Freq: Two times a day (BID) | INTRAVENOUS | Status: DC

## 2024-05-24 MED ORDER — SODIUM CHLORIDE 0.9 % IV SOLN: 250.0000 mL | INTRAVENOUS | Status: DC | PRN

## 2024-05-24 MED ORDER — SODIUM CHLORIDE 0.9 % IV SOLN: INTRAVENOUS | Status: DC

## 2024-05-24 MED ORDER — SODIUM CHLORIDE 0.9% FLUSH: 3.0000 mL | INTRAVENOUS | Status: DC | PRN

## 2024-05-24 MED ORDER — CEFAZOLIN SODIUM-DEXTROSE 2-3 GM-%(50ML) IV SOLR
INTRAVENOUS | Status: DC | PRN
  Administered 2024-05-24: 2 g via INTRAVENOUS

## 2024-05-24 MED ORDER — ONDANSETRON HCL 4 MG/2ML IJ SOLN: 4.0000 mg | Freq: Four times a day (QID) | INTRAMUSCULAR | Status: DC | PRN

## 2024-05-24 MED ORDER — ACETAMINOPHEN 325 MG PO TABS: 650.0000 mg | ORAL_TABLET | ORAL | Status: DC | PRN

## 2024-05-24 SURGICAL SUPPLY — 18 items
CABLE FARASTAR GEN2 SNGL USE (CABLE) IMPLANT
CATH EZ STEER NAV 8MM D-F CUR (ABLATOR) IMPLANT
CATH FARAWAVE 2.0 31 (CATHETERS) IMPLANT
CATH GE 8FR SOUNDSTAR (CATHETERS) IMPLANT
CATH OCTARAY 2.0 F 3-3-3-3-3 (CATHETERS) IMPLANT
CATH WEB BI DIR CSDF CRV REPRO (CATHETERS) IMPLANT
CLOSURE PERCLOSE PROSTYLE (Vascular Products) IMPLANT
DEVICE CLOSURE MYNXGRIP 6/7F (Vascular Products) IMPLANT
DILATOR VESSEL 38 20CM 16FR (INTRODUCER) IMPLANT
GUIDEWIRE INQWIRE 1.5J.035X260 (WIRE) IMPLANT
KIT VERSACROSS CNCT FARADRIVE (KITS) IMPLANT
PACK EP LF (CUSTOM PROCEDURE TRAY) ×1 IMPLANT
PAD DEFIB RADIO PHYSIO CONN (PAD) ×1 IMPLANT
PATCH CARTO3 (PAD) IMPLANT
SHEATH FARADRIVE STEERABLE (SHEATH) IMPLANT
SHEATH PINNACLE 8F 10CM (SHEATH) IMPLANT
SHEATH PINNACLE 9F 10CM (SHEATH) IMPLANT
SHEATH PROBE COVER 6X72 (BAG) IMPLANT

## 2024-05-24 NOTE — Anesthesia Preprocedure Evaluation (Signed)
 Anesthesia Evaluation  Patient identified by MRN, date of birth, ID band Patient awake    Reviewed: Allergy & Precautions, NPO status , Patient's Chart, lab work & pertinent test results, reviewed documented beta blocker date and time   History of Anesthesia Complications Negative for: history of anesthetic complications  Airway Mallampati: II  TM Distance: >3 FB     Dental no notable dental hx.    Pulmonary asthma , sleep apnea and Continuous Positive Airway Pressure Ventilation , neg COPD   breath sounds clear to auscultation       Cardiovascular hypertension, (-) angina (-) CAD and (-) Past MI + dysrhythmias Atrial Fibrillation  Rhythm:Regular Rate:Normal     Neuro/Psych neg Seizures  Neuromuscular disease    GI/Hepatic ,GERD  ,,(+) neg Cirrhosis        Endo/Other  Hypothyroidism    Renal/GU Renal disease     Musculoskeletal  (+) Arthritis , Osteoarthritis,    Abdominal   Peds  Hematology   Anesthesia Other Findings   Reproductive/Obstetrics                              Anesthesia Physical Anesthesia Plan  ASA: 2  Anesthesia Plan: General   Post-op Pain Management:    Induction: Intravenous  PONV Risk Score and Plan: 2 and Ondansetron  and Dexamethasone   Airway Management Planned: Oral ETT  Additional Equipment:   Intra-op Plan:   Post-operative Plan: Extubation in OR  Informed Consent: I have reviewed the patients History and Physical, chart, labs and discussed the procedure including the risks, benefits and alternatives for the proposed anesthesia with the patient or authorized representative who has indicated his/her understanding and acceptance.     Dental advisory given  Plan Discussed with: CRNA  Anesthesia Plan Comments:          Anesthesia Quick Evaluation

## 2024-05-24 NOTE — Addendum Note (Signed)
 Addendum  created 05/24/24 1150 by Roslynn Waddell LABOR, CRNA   Intraprocedure Meds edited

## 2024-05-24 NOTE — Progress Notes (Signed)
 Discharge instructions reviewed with patient and daughter at bedside. Denies questions concerns. PT tolerated PO intake. Ambulated in the hallway, was able to void without difficulty. Seen by MD. incision site remains clean dry and intact. No further s/s of complications (see previous note). PT escorted from the unit via wheel chair to personal vehicle.

## 2024-05-24 NOTE — Progress Notes (Signed)
 At 1320 after completion of 4 hour bedrest, the patient got up to ambulate. Ambulation occurred without difficulty. When returning to the room bleeding was noted on the right groin, pressure was held by this RN, after 10 mins bleeding ceased. MD notified by Charge nurse while pressure was being held, bedrest extended 1HR, will reattempt to ambulate at 1445. At 1445 patient ambulated again w/out complications this time.

## 2024-05-24 NOTE — Anesthesia Postprocedure Evaluation (Signed)
 Anesthesia Post Note  Patient: Michael Parsons  Procedure(s) Performed: A-FLUTTER ABLATION     Patient location during evaluation: PACU Anesthesia Type: General Level of consciousness: awake and alert Pain management: pain level controlled Vital Signs Assessment: post-procedure vital signs reviewed and stable Respiratory status: spontaneous breathing, nonlabored ventilation, respiratory function stable and patient connected to nasal cannula oxygen Cardiovascular status: blood pressure returned to baseline and stable Postop Assessment: no apparent nausea or vomiting Anesthetic complications: no   There were no known notable events for this encounter.  Last Vitals:  Vitals:   05/24/24 0531 05/24/24 1012  BP: 128/77 105/69  Pulse: 77 78  Resp: 16 18  Temp: 36.7 C   SpO2: 99% 94%    Last Pain: There were no vitals filed for this visit.               [name] MARLA Cornea

## 2024-05-24 NOTE — Anesthesia Procedure Notes (Signed)
 Procedure Name: Intubation Date/Time: 05/24/2024 7:57 AM  Performed by: Roslynn Waddell LABOR, CRNAPre-anesthesia Checklist: Patient identified, Emergency Drugs available, Suction available and Patient being monitored Patient Re-evaluated:Patient Re-evaluated prior to induction Oxygen Delivery Method: Circle System Utilized Preoxygenation: Pre-oxygenation with 100% oxygen Induction Type: IV induction Ventilation: Mask ventilation without difficulty Laryngoscope Size: Glidescope and 3 Grade View: Grade II Tube type: Oral Tube size: 7.5 mm Number of attempts: 1 Airway Equipment and Method: Stylet and Oral airway Placement Confirmation: ETT inserted through vocal cords under direct vision, positive ETCO2 and breath sounds checked- equal and bilateral Secured at: 23 cm Tube secured with: Tape Dental Injury: Injury to lip  Comments: Small cut to lower lip. GS 3 blade used with grade 2 view (likely needed GS 4 blade). ETT easily passed through glottis.

## 2024-05-24 NOTE — Discharge Instructions (Signed)

## 2024-05-24 NOTE — H&P (Signed)
 Electrophysiology Office Note:    Date:  05/24/2024   ID:  Michael Parsons, DOB 1956-04-02, MRN [id number]  PCP:  Michael Charlie ORN, MD   [facility] Providers Cardiologist:  None     Referring MD: No ref. provider found   History of Present Illness:    Michael Parsons is a 68 y.o. male with a medical history significant for atrial fibrillation, carotid artery disease, hypertension, sleep apnea, referred for arrhythmia management.       Discussed the use of AI scribe software for clinical note transcription with the patient, who gave verbal consent to proceed.  History of Present Illness  He experiences episodes of rapid heartbeats, initially triggered by extreme exertion, with associated elevated blood pressure and mild dyspnea. These episodes have occurred three times since last year. The most recent episode occurred at rest in [place] , with a heart rate of 130 bpm, leading to hospitalization and cardioversion.  He is on metoprolol , which have helped manage his heart rate. A recent episode of tachycardia occurred on Sunday evening, with a heart rate of 130 bpm, resolving within 15 minutes. His resting heart rate is typically in the low sixties, but has dropped to 48-50 bpm since starting metoprolol .  He was recently traveling in [place]  when he was noted to have a fast heart rate and went to the local ER and was diagnosed with atrial flutter.  He was placed on metoprolol  and underwent TEE cardioversion and discharged on Eliquis        Today, he reports that he feels well and has not acute complaints. He presents for atrial flutter ablation. He reports he has had several more episodes of rapid heart rates. I reviewed multiple tracings from his [facility]. Many of these show clear flutter waves and periods of regularity consistent with atrial flutter (he has the 6 lead version of the [employer]) . At least one tracing (eg 05/03/2024 at 08:20 showed an absolutely irregular  rhythm with a finely irregular baseline consistent with atrial fibrillation. For this reason, I think it prudent to perform a PVI during today's procedure. I discussed this with the patient. I explained increased risk of complications such as an embolic event that could cause issues like stroke or heart attack. He would like to proceed.  EKGs/Labs/Other Studies Reviewed Today:      EKG:         Physical Exam:    VS:  BP 128/77   Pulse 77   Temp 98 F (36.7 C)   Resp 16   Ht 6' (1.829 m)   Wt 107.5 kg   SpO2 99%   BMI 32.14 kg/m     Wt Readings from Last 3 Encounters:  05/24/24 107.5 kg  05/04/24 108.9 kg  04/28/24 108.9 kg     GEN: Well nourished, well developed in no acute distress CARDIAC: RRR, no murmurs, rubs, gallops RESPIRATORY:  Normal work of breathing MUSCULOSKELETAL: no edema    ASSESSMENT & PLAN:     Atrial flutter Documented in records from hospitalization in [place]  Status post TEE cardioversion We will request records --specifically, EKG showing flutter and TEE results Pending receipt of EKG documenting flutter, we will plan to proceed with a flutter ablation I discussed the indication and logistics of the flutter ablation with the patient and his partner.  I explained the risks including bleeding, organ damage, need for pacemaker.  Secondary hypercoagulable state CHA2DS2-VASc score is 3 Continue apixaban 5 mg p.o. twice daily  Hypertension BP controlled today  Obstructive sleep apnea Uses CPAP      Signed, Eulas FORBES Furbish, MD  05/24/2024 7:08 AM    [facility]

## 2024-05-24 NOTE — Transfer of Care (Signed)
 Immediate Anesthesia Transfer of Care Note  Patient: Michael Parsons  Procedure(s) Performed: A-FLUTTER ABLATION  Patient Location: PACU and Cath Lab  Anesthesia Type:General  Level of Consciousness: awake, alert , and oriented  Airway & Oxygen Therapy: Patient Spontanous Breathing  Post-op Assessment: Report given to RN and Post -op Vital signs reviewed and stable  Post vital signs: Reviewed and stable  Last Vitals:  Vitals Value Taken Time  BP 115/73  05/24/24 0939  Temp    Pulse 82 05/24/24 09:39  Resp 15 05/24/24 09:39  SpO2 96 % 05/24/24 09:39  Vitals shown include unfiled device data.  Last Pain: There were no vitals filed for this visit.       Complications: There were no known notable events for this encounter.

## 2024-05-25 ENCOUNTER — Encounter (HOSPITAL_COMMUNITY): Payer: Self-pay | Admitting: Cardiovascular Disease

## 2024-05-25 ENCOUNTER — Telehealth (HOSPITAL_COMMUNITY): Payer: Self-pay

## 2024-05-25 MED FILL — Fentanyl Citrate Preservative Free (PF) Inj 100 MCG/2ML: INTRAMUSCULAR | Qty: 2 | Status: AC

## 2024-05-25 MED FILL — Atropine Sulfate Soln Prefill Syr 1 MG/10ML (0.1 MG/ML): INTRAMUSCULAR | Qty: 10 | Status: AC

## 2024-05-25 MED FILL — Cefazolin Sodium-Dextrose IV Solution 2 GM/100ML-4%: INTRAVENOUS | Qty: 100 | Status: AC

## 2024-05-25 NOTE — Telephone Encounter (Signed)
 Spoke with patient to complete post procedure follow up call.  Patient reports no complications with groin sites.   Instructions reviewed with patient:  Remove large bandage at puncture site after 24 hours. It is normal to have bruising, tenderness, mild swelling, and a pea or marble sized lump/knot at the groin site which can take up to three months to resolve.  Get help right away if you notice sudden swelling at the puncture site.  Check your puncture site every day for signs of infection: fever, redness, swelling, pus drainage, warmth, foul odor or excessive pain. If this occurs, please call [phone], to speak with the RN Navigator. Get help right away if your puncture site is bleeding and the bleeding does not stop after applying firm pressure to the area.  You may continue to have skipped beats/ atrial flutter during the first several months after your procedure.  It is very important not to miss any doses of your blood thinner Eliquis.    You will follow up with Dr. Nancey 4 weeks after your procedure.  Activity restrictions reviewed.  Patient verbalized understanding to all instructions provided.

## 2024-06-02 NOTE — Telephone Encounter (Signed)
 The Kardia reading is already attached to this encounter

## 2024-07-04 ENCOUNTER — Encounter: Payer: Self-pay | Admitting: Cardiovascular Disease

## 2024-07-04 ENCOUNTER — Ambulatory Visit: Attending: Cardiovascular Disease | Admitting: Cardiovascular Disease

## 2024-07-04 VITALS — BP 110/62 | HR 63 | Ht 72.0 in | Wt 245.0 lb

## 2024-07-04 DIAGNOSIS — I4891 Unspecified atrial fibrillation: Secondary | ICD-10-CM | POA: Diagnosis not present

## 2024-07-04 DIAGNOSIS — I483 Typical atrial flutter: Secondary | ICD-10-CM | POA: Diagnosis not present

## 2024-07-04 NOTE — Patient Instructions (Signed)
 Medication Instructions:  Your physician recommends that you continue on your current medications as directed. Please refer to the Current Medication list given to you today.  *If you need a refill on your cardiac medications before your next appointment, please call your pharmacy*  Lab Work: None ordered.  If you have labs (blood work) drawn today and your tests are completely normal, you will receive your results only by: MyChart Message (if you have MyChart) OR A paper copy in the mail If you have any lab test that is abnormal or we need to change your treatment, we will call you to review the results.  Testing/Procedures: None ordered.   Follow-Up: At [facility], you and your health needs are our priority.  As part of our continuing mission to provide you with exceptional heart care, our providers are all part of one team.  This team includes your primary Cardiologist (physician) and Advanced Practice Providers or APPs (Physician Assistants and Nurse Practitioners) who all work together to provide you with the care you need, when you need it.  Your next appointment:   6 months with Dr Marko PA

## 2024-07-04 NOTE — Progress Notes (Signed)
" °  Electrophysiology Office Note:    Date:  07/04/2024   ID:  ZAION HREHA, DOB 02/05/1956, MRN [id number]  PCP:  Vernadine Charlie ORN, MD   [facility] Providers Cardiologist:  None     Referring MD: Vernadine Charlie ORN, MD   History of Present Illness:    Michael Parsons is a 69 y.o. male with a medical history significant for atrial fibrillation, carotid artery disease, hypertension, sleep apnea, referred for arrhythmia management.       Discussed the use of AI scribe software for clinical note transcription with the patient, who gave verbal consent to proceed.  History of Present Illness  He experiences episodes of rapid heartbeats, initially triggered by extreme exertion, with associated elevated blood pressure and mild dyspnea. These episodes have occurred three times since last year. The most recent episode occurred at rest in [place] , with a heart rate of 130 bpm, leading to hospitalization and cardioversion.  He is on metoprolol , which have helped manage his heart rate. A recent episode of tachycardia occurred on Sunday evening, with a heart rate of 130 bpm, resolving within 15 minutes. His resting heart rate is typically in the low sixties, but has dropped to 48-50 bpm since starting metoprolol .  He was recently traveling in [place]  when he was noted to have a fast heart rate and went to the local ER and was diagnosed with atrial flutter.  He was placed on metoprolol  and underwent TEE cardioversion and discharged on Eliquis         Today, he reports that he feels well and has not acute complaints.  EKGs/Labs/Other Studies Reviewed Today:      EKG:   EKG Interpretation Date/Time:  Monday July 04 2024 15:39:06 EST Ventricular Rate:  63 PR Interval:  176 QRS Duration:  112 QT Interval:  416 QTC Calculation: 425 R Axis:   1  Text Interpretation: Normal sinus rhythm Normal ECG When compared with ECG of 24-May-2024 09:55, No significant change was found  Confirmed by Nancey Scotts [phone]) on 07/04/2024 3:50:01 PM     Physical Exam:    VS:  BP 110/62 (BP Location: Left Arm, Patient Position: Sitting, Cuff Size: Large)   Pulse 63   Ht 6' (1.829 m)   Wt 245 lb (111.1 kg)   SpO2 96%   BMI 33.23 kg/m     Wt Readings from Last 3 Encounters:  07/04/24 245 lb (111.1 kg)  05/24/24 237 lb (107.5 kg)  05/04/24 240 lb (108.9 kg)     GEN: Well nourished, well developed in no acute distress CARDIAC: RRR, no murmurs, rubs, gallops RESPIRATORY:  Normal work of breathing MUSCULOSKELETAL: no edema    ASSESSMENT & PLAN:     Atrial flutter and atrial fibrillation Documented in records from hospitalization in [place]  Status post ablation of atrial flutter and atrial fibrillation May 24, 2024 1 brief episode of recurrence approximately 2 weeks after the ablation Doing well, continue to monitor  Secondary hypercoagulable state CHA2DS2-VASc score is 3 Continue apixaban  5 mg p.o. twice daily We discussed possibility of discontinuing anticoagulation if he remains atrial fibrillation free, particular if he gets a wearable monitor  Hypertension BP controlled today  Obstructive sleep apnea Uses CPAP      Signed, Scotts FORBES Nancey, MD  07/04/2024 3:51 PM    [facility] "

## 2024-07-05 MED ORDER — ELIQUIS 5 MG PO TABS: 5.0000 mg | ORAL_TABLET | Freq: Two times a day (BID) | ORAL | 3 refills | Status: AC

## 2024-07-15 ENCOUNTER — Ambulatory Visit

## 2024-07-15 VITALS — BP 118/74 | HR 62 | Temp 97.4°F | Ht 72.0 in | Wt 241.0 lb

## 2024-07-15 DIAGNOSIS — G4733 Obstructive sleep apnea (adult) (pediatric): Secondary | ICD-10-CM

## 2024-07-15 DIAGNOSIS — J4520 Mild intermittent asthma, uncomplicated: Secondary | ICD-10-CM

## 2024-07-15 MED ORDER — FLUTICASONE-SALMETEROL 250-50 MCG/ACT IN AEPB: 1.0000 | INHALATION_SPRAY | Freq: Two times a day (BID) | RESPIRATORY_TRACT | 11 refills | Status: AC

## 2024-07-15 MED ORDER — ALBUTEROL SULFATE HFA 108 (90 BASE) MCG/ACT IN AERS: 1.0000 | INHALATION_SPRAY | Freq: Four times a day (QID) | RESPIRATORY_TRACT | 5 refills | Status: DC | PRN

## 2024-07-15 MED ORDER — MONTELUKAST SODIUM 10 MG PO TABS: 10.0000 mg | ORAL_TABLET | Freq: Every day | ORAL | 4 refills | Status: AC

## 2024-07-15 NOTE — Progress Notes (Signed)
 "   Subjective:   PATIENT ID: Michael Parsons GENDER: male DOB: 18-Mar-1956, MRN: [id number]   HPI Discussed the use of AI scribe software for clinical note transcription with the patient, who gave verbal consent to proceed.  History of Present Illness Michael Parsons is a 69 year old male with mild asthma who presents for follow-up regarding asthma management and medication refills.  He has mild asthma, which he describes as potentially seasonal and exercise-induced. He experiences nasal congestion and allergies, which he believes contribute to his asthma symptoms. He uses a rescue inhaler, albuterol , as needed. In 2023, he experienced a bronchial issue that was initially treated with antibiotics without resolution. By the time he consulted with another physician, the symptoms had mostly cleared, and it was suggested that the episode was an asthma reaction. He was prescribed Breo by his primary care physician, but due to cost, he switched to a more affordable alternative, Symbicort  (budesonide /formoterol ), which he uses as needed before exposure to known triggers such as visiting his mother's house where there are cats. He last purchased Symbicort  in January of the previous year and used it on one occasion. He is concerned about the cost and the expiration of the medication, noting that he prefers to have it available but not at a high expense.  He discusses his use of a CPAP machine for obstructive sleep apnea, noting recent issues with dry mouth and high leakage rates. He tried a larger mask but found it uncomfortable due to claustrophobia. A chin strap provided by his durable medical equipment provider has been effective in managing these issues.  He takes montelukast  every night and requires a new prescription due to a change in his Part D insurance. He prefers a three-month supply with four refills for the year.     Past Medical History:  Diagnosis Date   Arthritis    carpel tunnel    Asthma    does not have to use rescue inhaler often   Asthma    BPH (benign prostatic hyperplasia)    Deviated septum    DVT (deep venous thrombosis) (HCC)    GERD (gastroesophageal reflux disease)    Hyperlipidemia    Hypertension    Hypothyroidism    Hypothyroidism    Sleep apnea    cpap at night     Family History  Problem Relation Age of Onset   Sleep apnea Mother    Asthma Mother      Social History   Socioeconomic History   Marital status: Single    Spouse name: Not on file   Number of children: Not on file   Years of education: Not on file   Highest education level: Not on file  Occupational History   Not on file  Tobacco Use   Smoking status: Never   Smokeless tobacco: Never  Vaping Use   Vaping status: Never Used  Substance and Sexual Activity   Alcohol use: Not Currently   Drug use: No   Sexual activity: Not on file  Other Topics Concern   Not on file  Social History Narrative   Drinks decaf coffee now   Social Drivers of Health   Tobacco Use: Low Risk (07/15/2024)   Patient History    Smoking Tobacco Use: Never    Smokeless Tobacco Use: Never    Passive Exposure: Not on file  Financial Resource Strain: Not on file  Food Insecurity: Not on file  Transportation Needs: Not on file  Physical  Activity: Not on file  Stress: Not on file  Social Connections: Not on file  Intimate Partner Violence: Not on file  Depression (EYV7-0): Not on file  Alcohol Screen: Not on file  Housing: Not on file  Utilities: Not on file  Health Literacy: Not on file     Allergies[1]   Outpatient Medications Prior to Visit  Medication Sig Dispense Refill   albuterol  (VENTOLIN  HFA) 108 (90 Base) MCG/ACT inhaler 2 puffs Inhalation 4x a day PRN for shortness of breath     b complex vitamins capsule Take 1 capsule by mouth daily.     ELIQUIS  5 MG TABS tablet Take 1 tablet (5 mg total) by mouth 2 (two) times daily. 180 tablet 3   fluticasone (FLONASE) 50 MCG/ACT nasal  spray Place 1 spray into both nostrils 2 (two) times daily.     levothyroxine  (SYNTHROID , LEVOTHROID) 88 MCG tablet Take 88 mcg by mouth at bedtime.      Magnesium Citrate 200 MG TABS Take 200 mg by mouth every Monday, Wednesday, and Friday.     metoprolol  tartrate (LOPRESSOR ) 25 MG tablet Take 1 tablet (25 mg total) by mouth 2 (two) times daily as needed. For recurrent atrial flutter, HRs > 110 bpm.     montelukast  (SINGULAIR ) 10 MG tablet TAKE 1 TABLET AT BEDTIME 90 tablet 3   Omega-3 Fatty Acids (FISH OIL PO) Take 1,250 mg by mouth daily. Omega 3 1055 mg     omeprazole (PRILOSEC) 20 MG capsule Take 20 mg by mouth daily.     OVER THE COUNTER MEDICATION Take 600 mg by mouth daily. Kyolic aged garlic extract     rosuvastatin (CRESTOR) 10 MG tablet Take 10 mg by mouth daily.     tamsulosin (FLOMAX) 0.4 MG CAPS capsule Take 0.4 mg by mouth daily.     TURMERIC CURCUMIN PO Take 2,250 mg by mouth every other day.     budesonide -formoterol  (SYMBICORT ) 160-4.5 MCG/ACT inhaler Inhale 2 puffs into the lungs in the morning and at bedtime. (Patient not taking: Reported on 07/15/2024) 1 each 5   lisinopril  (ZESTRIL ) 20 MG tablet Take 10 mg by mouth daily.     No facility-administered medications prior to visit.    ROS Reviewed all systems and reported negative except as above     Objective:   Vitals:   07/15/24 0850  BP: 118/74  Pulse: 62  Temp: (!) 97.4 F (36.3 C)  TempSrc: Oral  SpO2: 97%  Weight: 241 lb (109.3 kg)  Height: 6' (1.829 m)    Physical Exam Physical Exam GENERAL: Appropriate to age, no acute distress. HEAD EYES EARS NOSE THROAT: Moist mucous membranes, atraumatic, normocephalic. CHEST: Clear to auscultation bilaterally, no wheezing, no crackles, no rales. CARDIAC: Regular rate and rhythm, normal S1, normal S2, no murmurs, no rubs, no gallops. ABDOMEN: Soft, nontender. NEUROLOGICAL: Motor and sensation grossly intact, alert and oriented times X 3. EXTREMITIES: Warm, well  perfused, no edema.     CBC    Component Value Date/Time   WBC 7.1 04/28/2024 1010   WBC 10.6 (H) 04/20/2024 1152   RBC 5.09 04/28/2024 1010   RBC 5.21 04/20/2024 1152   HGB 16.1 04/28/2024 1010   HCT 48.3 04/28/2024 1010   PLT 207 04/28/2024 1010   MCV 95 04/28/2024 1010   MCH 31.6 04/28/2024 1010   MCH 30.9 04/20/2024 1152   MCHC 33.3 04/28/2024 1010   MCHC 34.2 04/20/2024 1152   RDW 12.7 04/28/2024 1010  LYMPHSABS 1.2 04/20/2024 1152   MONOABS 0.7 04/20/2024 1152   EOSABS 0.0 04/20/2024 1152   BASOSABS 0.1 04/20/2024 1152          Assessment & Plan:   Assessment and Plan Assessment & Plan Mild intermittent asthma Mild intermittent asthma, possibly seasonal and exercise-induced. Discussed alternative options including Advair, which is more affordable without insurance. Symbicort  can be used as a rescue inhaler, unlike Advair, which requires a minimum two-week commitment. Discussed the use of GoodRx for cost-effective options. - Prescribed Advair 250 mcg, one puff twice a day, for at least two weeks during stress triggers. - Sent Advair prescription to [facility] at [address]. - Sent albuterol  prescription to [facility] for a three-month supply. - Advised to use GoodRx for cost-effective options.  Obstructive sleep apnea Managed with CPAP. Recent issues with dry mouth and high leakage rate noted. Currently using a chin strap, which has improved symptoms. - Continue using CPAP with chin strap.        Zola Herter, MD [facility] Office: [phone]       [1]  Allergies Allergen Reactions   Cat Dander     Other Reaction(s): cough   Sulfa Antibiotics Other (See Comments)    Unknown reaction   "

## 2024-07-15 NOTE — Patient Instructions (Signed)
" °  VISIT SUMMARY: During your visit, we discussed your asthma management and medication refills, as well as your obstructive sleep apnea treatment. We reviewed your current medications and explored more cost-effective options for your asthma treatment. We also addressed your CPAP machine issues and provided recommendations to improve your comfort and effectiveness.  YOUR PLAN: -MILD INTERMITTENT ASTHMA: Mild intermittent asthma is a condition where you experience occasional asthma symptoms, often triggered by specific factors like exercise or allergens. We discussed switching to Advair, which is more affordable and requires a two-week commitment. You can use Symbicort  as a rescue inhaler. We also recommended using GoodRx to find cost-effective options. Your [facility] prescription has [facility] sent to [facility], and your albuterol  prescription has been sent to [facility] for a three-month supply.  -OBSTRUCTIVE SLEEP APNEA: Obstructive sleep apnea is a condition where your breathing stops and starts during sleep due to blocked airways. You are currently managing this with a CPAP machine and a chin strap to reduce dry mouth and leakage. Continue using the CPAP with the chin strap to maintain symptom control.  INSTRUCTIONS: Please follow up with us  if you experience any issues with your new medications or if your symptoms worsen. Ensure you use GoodRx to find the most cost-effective options for your prescriptions. Continue using your CPAP machine with the chin strap and let us  know if you have any further issues with dry mouth or leakage.    Contains text generated by [employer]bridge.   "

## 2024-07-19 MED ORDER — ALBUTEROL SULFATE HFA 108 (90 BASE) MCG/ACT IN AERS: 1.0000 | INHALATION_SPRAY | Freq: Four times a day (QID) | RESPIRATORY_TRACT | 5 refills | Status: AC | PRN

## 2024-07-20 ENCOUNTER — Ambulatory Visit: Admitting: Cardiovascular Disease

## 2024-08-03 ENCOUNTER — Ambulatory Visit: Admitting: Neurology

## 2025-05-09 ENCOUNTER — Ambulatory Visit: Admitting: Adult Health
